# Patient Record
Sex: Female | Born: 1935 | Race: White | Hispanic: No | Smoking: Former smoker
Health system: Southern US, Community
[De-identification: ages and names within clinical notes are randomized; demographics above are authoritative.]

## PROBLEM LIST (undated history)

## (undated) DIAGNOSIS — F32A Depression, unspecified: Secondary | ICD-10-CM

## (undated) DIAGNOSIS — E039 Hypothyroidism, unspecified: Secondary | ICD-10-CM

## (undated) DIAGNOSIS — L4050 Arthropathic psoriasis, unspecified: Secondary | ICD-10-CM

## (undated) DIAGNOSIS — J449 Chronic obstructive pulmonary disease, unspecified: Secondary | ICD-10-CM

## (undated) DIAGNOSIS — F329 Major depressive disorder, single episode, unspecified: Secondary | ICD-10-CM

## (undated) DIAGNOSIS — I739 Peripheral vascular disease, unspecified: Secondary | ICD-10-CM

## (undated) DIAGNOSIS — R32 Unspecified urinary incontinence: Secondary | ICD-10-CM

## (undated) DIAGNOSIS — E785 Hyperlipidemia, unspecified: Secondary | ICD-10-CM

## (undated) DIAGNOSIS — I10 Essential (primary) hypertension: Secondary | ICD-10-CM

## (undated) DIAGNOSIS — G4730 Sleep apnea, unspecified: Secondary | ICD-10-CM

## (undated) DIAGNOSIS — R011 Cardiac murmur, unspecified: Secondary | ICD-10-CM

## (undated) DIAGNOSIS — B019 Varicella without complication: Secondary | ICD-10-CM

## (undated) DIAGNOSIS — Z9981 Dependence on supplemental oxygen: Secondary | ICD-10-CM

## (undated) DIAGNOSIS — K5792 Diverticulitis of intestine, part unspecified, without perforation or abscess without bleeding: Secondary | ICD-10-CM

## (undated) HISTORY — PX: CATARACT EXTRACTION: SUR2

## (undated) HISTORY — DX: Hypothyroidism, unspecified: E03.9

## (undated) HISTORY — PX: COLON RESECTION: SHX5231

## (undated) HISTORY — DX: Dependence on supplemental oxygen: Z99.81

## (undated) HISTORY — PX: VEIN LIGATION AND STRIPPING: SHX2653

## (undated) HISTORY — DX: Major depressive disorder, single episode, unspecified: F32.9

## (undated) HISTORY — DX: Peripheral vascular disease, unspecified: I73.9

## (undated) HISTORY — DX: Depression, unspecified: F32.A

## (undated) HISTORY — DX: Diverticulitis of intestine, part unspecified, without perforation or abscess without bleeding: K57.92

## (undated) HISTORY — PX: LUMBAR LAMINECTOMY: SHX95

## (undated) HISTORY — DX: Sleep apnea, unspecified: G47.30

## (undated) HISTORY — DX: Unspecified urinary incontinence: R32

## (undated) HISTORY — DX: Essential (primary) hypertension: I10

## (undated) HISTORY — DX: Hyperlipidemia, unspecified: E78.5

## (undated) HISTORY — DX: Varicella without complication: B01.9

## (undated) HISTORY — DX: Chronic obstructive pulmonary disease, unspecified: J44.9

## (undated) HISTORY — PX: OTHER SURGICAL HISTORY: SHX169

## (undated) HISTORY — DX: Arthropathic psoriasis, unspecified: L40.50

## (undated) HISTORY — DX: Cardiac murmur, unspecified: R01.1

## (undated) HISTORY — PX: INGUINAL HERNIA REPAIR: SUR1180

---

## 1975-10-12 HISTORY — PX: ABDOMINAL HYSTERECTOMY: SHX81

## 2016-10-06 ENCOUNTER — Ambulatory Visit: Payer: Self-pay | Admitting: Adult Health

## 2016-10-07 ENCOUNTER — Encounter: Payer: Self-pay | Admitting: Adult Health

## 2016-10-07 ENCOUNTER — Ambulatory Visit (INDEPENDENT_AMBULATORY_CARE_PROVIDER_SITE_OTHER): Payer: Medicare Other | Admitting: Adult Health

## 2016-10-07 VITALS — BP 128/80 | Temp 98.1°F | Wt 202.2 lb

## 2016-10-07 DIAGNOSIS — R109 Unspecified abdominal pain: Secondary | ICD-10-CM

## 2016-10-07 DIAGNOSIS — I872 Venous insufficiency (chronic) (peripheral): Secondary | ICD-10-CM

## 2016-10-07 DIAGNOSIS — J449 Chronic obstructive pulmonary disease, unspecified: Secondary | ICD-10-CM

## 2016-10-07 DIAGNOSIS — I10 Essential (primary) hypertension: Secondary | ICD-10-CM

## 2016-10-07 DIAGNOSIS — Z7689 Persons encountering health services in other specified circumstances: Secondary | ICD-10-CM

## 2016-10-07 MED ORDER — ATENOLOL 25 MG PO TABS: 25.0000 mg | ORAL_TABLET | Freq: Every day | ORAL | 3 refills | Status: AC

## 2016-10-07 NOTE — Patient Instructions (Signed)
It was great meeting you today!  Someone will call you to schedule your appointments with Vascular, GI, and Pulmonary.   I will get your paperwork from records from the previous family doctor. I will call you once I get everything and we can discuss follow up. Feel free to call me with any issues or concerns in the meantime.   I have added 25 mg of Atenolol to your night time regimen to help control your blood pressure through the night. Continue to monitor your blood pressure for me. Bring log to next appointment.

## 2016-10-07 NOTE — Progress Notes (Signed)
Patient presents to clinic today to establish care. She is a pleasant 82 year old female who  has a past medical history of Chicken pox; COPD (chronic obstructive pulmonary disease) (HCC); Depression; Diverticulitis; Essential hypertension; Heart murmur; Hyperlipidemia; Hypothyroidism; Psoriatic arthritis (HCC); and Urinary incontinence.   She is a poor historian   [place].   She believes her last physical was one year ago.   Acute Concerns: Establish Care   Chronic Issues: - Abdominal Cramping - happens after eating. She has been seen by GI in the past in [place]. She denies any constant diarrhea. Has a history of colon resection due to abscessed diverticula.   Essential Hypertension  - She takes Atenolol 50 mg and Chlorthalidone 25 mg. She reports that her blood pressure continues to be elevated. She monitors her blood pressure at home and reports that her blood pressure is elevated in the mornings. She has readings- per her log in the 180's when she wakes up.   Hypothyroidism - Takes Synthroid 150 mcg and feels as though she is well controlled on this medication  Venous Insufficiency - Unsure if she was seen by vascular in the past. She reports have discoloration with occasional episodes of numbness and tingling in her lower extremities. She does not wear compression socks on a regular basis.   Health Maintenance: Dental -- has dentures [facility] -- Does Routine Care. Immunizations -- Unknown UTD  Colonoscopy -- No longer needs Mammogram -- No longer needs PAP -- No longer needs Bone Density -- Unknown   Is followed by  1. Pulmonary - Was seeing Pulmonary in [place]. She will need to see someone here    Past Medical History:  Diagnosis Date  . Chicken pox   . COPD (chronic obstructive pulmonary disease) (HCC)   . Depression   . Diverticulitis   . Essential hypertension   . Heart murmur   . Hyperlipidemia   .  Hypothyroidism   . Psoriatic arthritis (HCC)   . Urinary incontinence     Past Surgical History:  Procedure Laterality Date  . ABDOMINAL HYSTERECTOMY  1977  . Appendectomy    . CATARACT EXTRACTION Bilateral   . COLON RESECTION    . INGUINAL HERNIA REPAIR    . Knee replacement     . LUMBAR LAMINECTOMY      No current outpatient prescriptions on file prior to visit.   No current facility-administered medications on file prior to visit.     Allergies not on file  Family History  Problem Relation Age of Onset  . Hyperlipidemia Father   . Hypertension Father   . Breast cancer Maternal Grandmother   . Heart attack Maternal Grandfather   . Heart disease Maternal Uncle     Social History   Social History  . Marital status: Legally Separated    Spouse name: N/A  . Number of children: N/A  . Years of education: N/A   Occupational History  . Not on file.   Social History Main Topics  . Smoking status: Former Games developermoker  . Smokeless tobacco: Never Used  . Alcohol use Yes     Comment:  socially  . Drug use: No  . Sexual activity: Not on file   Other Topics Concern  . Not on file   Social History Narrative   Sepeated    Worked as Radiology    Has a son and daughter. [place]  and works at [employer].     Review of Systems  Constitutional: Negative.   HENT: Negative.   Eyes: Negative.   Respiratory: Negative.   Cardiovascular: Negative.   Gastrointestinal: Positive for abdominal pain.  Genitourinary: Negative.        Incontinence   Musculoskeletal: Negative.   Skin: Negative.   Neurological: Negative.   Endo/Heme/Allergies: Negative.   Psychiatric/Behavioral: Negative.   All other systems reviewed and are negative.   BP 128/80   Temp 98.1 F (36.7 C) (Oral)   Wt 202 lb 4 oz (91.7 kg)   Physical Exam  Constitutional: She is oriented to person, place, and time and well-developed, well-nourished, and in no distress. No distress.  Eyes:  Conjunctivae and EOM are normal. Pupils are equal, round, and reactive to light. Right eye exhibits no discharge. Left eye exhibits no discharge. No scleral icterus.  Neck: Normal range of motion. Neck supple. No JVD present. No tracheal deviation present. No thyromegaly present.  Cardiovascular: Normal rate, regular rhythm, normal heart sounds and intact distal pulses.  Exam reveals no gallop and no friction rub.   No murmur heard. Pulmonary/Chest: Effort normal and breath sounds normal. No stridor. No respiratory distress. She has no wheezes. She has no rales. She exhibits no tenderness.  1.5 liters via [place]   Musculoskeletal:  Has steady gait with rolling walker  Lymphadenopathy:    She has no cervical adenopathy.  Neurological: She is alert and oriented to person, place, and time. She has normal reflexes. She displays normal reflexes. No cranial nerve deficit. She exhibits normal muscle tone. Gait normal. Coordination normal. GCS score is 15.  Skin: Skin is warm and dry. She is not diaphoretic.  Skin discoloration in bilateral lower extremities   Psychiatric: Mood, memory, affect and judgment normal.  Nursing note and vitals reviewed.  Assessment/Plan: 1. Encounter to establish care - Will request records from previous provider.  - Follow up for CPE or sooner with any acute issues.  - Exercise as tolerated.   2. Abdominal cramping  - Ambulatory referral to Gastroenterology  3. Venous insufficiency  - Ambulatory referral to Vascular Surgery  4. Chronic obstructive pulmonary disease, unspecified COPD type (HCC)  - Ambulatory referral to Pulmonology  5. Essential hypertension - I am going to have her take 25 mg of Atenolol in the evening. Continue to monitor BP and follow up with me in 1-2 weeks. Bring log to next appointment. Return precautions given    Shirline Freesory Bambie Pizzolato, NP

## 2016-10-07 NOTE — Progress Notes (Signed)
Pre visit review using our clinic review tool, if applicable. No additional management support is needed unless otherwise documented below in the visit note. 

## 2016-10-12 ENCOUNTER — Encounter: Payer: Self-pay | Admitting: Adult Health

## 2016-10-13 ENCOUNTER — Encounter: Payer: Self-pay | Admitting: Gastroenterology

## 2016-10-13 DIAGNOSIS — J449 Chronic obstructive pulmonary disease, unspecified: Secondary | ICD-10-CM | POA: Insufficient documentation

## 2016-10-15 ENCOUNTER — Other Ambulatory Visit: Payer: Self-pay | Admitting: *Deleted

## 2016-10-15 DIAGNOSIS — I872 Venous insufficiency (chronic) (peripheral): Secondary | ICD-10-CM

## 2016-10-21 ENCOUNTER — Inpatient Hospital Stay (HOSPITAL_COMMUNITY)
Admission: EM | Admit: 2016-10-21 | Discharge: 2016-10-29 | DRG: 190 | Disposition: A | Discharge status: Home Health | Payer: Medicare Other | Attending: Internal Medicine | Admitting: Internal Medicine

## 2016-10-21 ENCOUNTER — Encounter (HOSPITAL_COMMUNITY): Payer: Self-pay

## 2016-10-21 ENCOUNTER — Ambulatory Visit: Payer: Medicare Other | Admitting: Gastroenterology

## 2016-10-21 ENCOUNTER — Emergency Department (HOSPITAL_COMMUNITY): Payer: Medicare Other

## 2016-10-21 DIAGNOSIS — G2581 Restless legs syndrome: Secondary | ICD-10-CM | POA: Diagnosis not present

## 2016-10-21 DIAGNOSIS — L4050 Arthropathic psoriasis, unspecified: Secondary | ICD-10-CM | POA: Diagnosis present

## 2016-10-21 DIAGNOSIS — F419 Anxiety disorder, unspecified: Secondary | ICD-10-CM | POA: Diagnosis present

## 2016-10-21 DIAGNOSIS — Z8249 Family history of ischemic heart disease and other diseases of the circulatory system: Secondary | ICD-10-CM

## 2016-10-21 DIAGNOSIS — J441 Chronic obstructive pulmonary disease with (acute) exacerbation: Secondary | ICD-10-CM | POA: Diagnosis not present

## 2016-10-21 DIAGNOSIS — J9622 Acute and chronic respiratory failure with hypercapnia: Secondary | ICD-10-CM | POA: Diagnosis present

## 2016-10-21 DIAGNOSIS — Z515 Encounter for palliative care: Secondary | ICD-10-CM

## 2016-10-21 DIAGNOSIS — Z66 Do not resuscitate: Secondary | ICD-10-CM | POA: Diagnosis not present

## 2016-10-21 DIAGNOSIS — Z7189 Other specified counseling: Secondary | ICD-10-CM

## 2016-10-21 DIAGNOSIS — E785 Hyperlipidemia, unspecified: Secondary | ICD-10-CM | POA: Diagnosis present

## 2016-10-21 DIAGNOSIS — R52 Pain, unspecified: Secondary | ICD-10-CM

## 2016-10-21 DIAGNOSIS — Z79899 Other long term (current) drug therapy: Secondary | ICD-10-CM

## 2016-10-21 DIAGNOSIS — E222 Syndrome of inappropriate secretion of antidiuretic hormone: Secondary | ICD-10-CM | POA: Diagnosis present

## 2016-10-21 DIAGNOSIS — Z888 Allergy status to other drugs, medicaments and biological substances status: Secondary | ICD-10-CM

## 2016-10-21 DIAGNOSIS — F32A Depression, unspecified: Secondary | ICD-10-CM

## 2016-10-21 DIAGNOSIS — J9621 Acute and chronic respiratory failure with hypoxia: Secondary | ICD-10-CM | POA: Diagnosis present

## 2016-10-21 DIAGNOSIS — R0789 Other chest pain: Secondary | ICD-10-CM | POA: Diagnosis not present

## 2016-10-21 DIAGNOSIS — Z885 Allergy status to narcotic agent status: Secondary | ICD-10-CM

## 2016-10-21 DIAGNOSIS — E039 Hypothyroidism, unspecified: Secondary | ICD-10-CM

## 2016-10-21 DIAGNOSIS — Z87891 Personal history of nicotine dependence: Secondary | ICD-10-CM

## 2016-10-21 DIAGNOSIS — I10 Essential (primary) hypertension: Secondary | ICD-10-CM | POA: Diagnosis not present

## 2016-10-21 DIAGNOSIS — J9611 Chronic respiratory failure with hypoxia: Secondary | ICD-10-CM

## 2016-10-21 DIAGNOSIS — Z7952 Long term (current) use of systemic steroids: Secondary | ICD-10-CM

## 2016-10-21 DIAGNOSIS — Z9981 Dependence on supplemental oxygen: Secondary | ICD-10-CM

## 2016-10-21 DIAGNOSIS — F329 Major depressive disorder, single episode, unspecified: Secondary | ICD-10-CM

## 2016-10-21 DIAGNOSIS — I739 Peripheral vascular disease, unspecified: Secondary | ICD-10-CM | POA: Diagnosis present

## 2016-10-21 DIAGNOSIS — Z96652 Presence of left artificial knee joint: Secondary | ICD-10-CM | POA: Diagnosis present

## 2016-10-21 DIAGNOSIS — B974 Respiratory syncytial virus as the cause of diseases classified elsewhere: Secondary | ICD-10-CM | POA: Diagnosis present

## 2016-10-21 DIAGNOSIS — F411 Generalized anxiety disorder: Secondary | ICD-10-CM

## 2016-10-21 DIAGNOSIS — G4733 Obstructive sleep apnea (adult) (pediatric): Secondary | ICD-10-CM | POA: Diagnosis present

## 2016-10-21 LAB — RESPIRATORY PANEL BY PCR
ADENOVIRUS-RVPPCR: NOT DETECTED
BORDETELLA PERTUSSIS-RVPCR: NOT DETECTED
CHLAMYDOPHILA PNEUMONIAE-RVPPCR: NOT DETECTED
CORONAVIRUS 229E-RVPPCR: NOT DETECTED
CORONAVIRUS HKU1-RVPPCR: NOT DETECTED
CORONAVIRUS NL63-RVPPCR: NOT DETECTED
Coronavirus OC43: NOT DETECTED
Influenza A: NOT DETECTED
Influenza B: NOT DETECTED
Metapneumovirus: NOT DETECTED
Mycoplasma pneumoniae: NOT DETECTED
PARAINFLUENZA VIRUS 2-RVPPCR: NOT DETECTED
Parainfluenza Virus 1: NOT DETECTED
Parainfluenza Virus 3: NOT DETECTED
Parainfluenza Virus 4: NOT DETECTED
Respiratory Syncytial Virus: DETECTED — AB
Rhinovirus / Enterovirus: NOT DETECTED

## 2016-10-21 LAB — BASIC METABOLIC PANEL
Anion gap: 10 (ref 5–15)
BUN: 15 mg/dL (ref 6–20)
CO2: 35 mmol/L — ABNORMAL HIGH (ref 22–32)
CREATININE: 0.65 mg/dL (ref 0.44–1.00)
Calcium: 9.2 mg/dL (ref 8.9–10.3)
Chloride: 84 mmol/L — ABNORMAL LOW (ref 101–111)
GFR calc Af Amer: >60 mL/min (ref >60)
GLUCOSE: 93 mg/dL (ref 65–99)
POTASSIUM: 3.7 mmol/L (ref 3.5–5.1)
SODIUM: 129 mmol/L — AB (ref 135–145)

## 2016-10-21 LAB — I-STAT ARTERIAL BLOOD GAS, ED
Acid-Base Excess: 9 mmol/L — ABNORMAL HIGH (ref 0.0–2.0)
BICARBONATE: 36.3 mmol/L — AB (ref 20.0–28.0)
O2 Saturation: 92 %
PO2 ART: 66 mmHg — AB (ref 83.0–108.0)
TCO2: 38 mmol/L (ref 0–100)
pCO2 arterial: 60.5 mmHg — ABNORMAL HIGH (ref 32.0–48.0)
pH, Arterial: 7.386 (ref 7.350–7.450)

## 2016-10-21 LAB — BRAIN NATRIURETIC PEPTIDE: B Natriuretic Peptide: 82.4 pg/mL (ref 0.0–100.0)

## 2016-10-21 LAB — CBC
HCT: 38.4 % (ref 36.0–46.0)
Hemoglobin: 13 g/dL (ref 12.0–15.0)
MCH: 30.6 pg (ref 26.0–34.0)
MCHC: 33.9 g/dL (ref 30.0–36.0)
MCV: 90.4 fL (ref 78.0–100.0)
PLATELETS: 273 10*3/uL (ref 150–400)
RBC: 4.25 MIL/uL (ref 3.87–5.11)
RDW: 12.6 % (ref 11.5–15.5)
WBC: 5.5 10*3/uL (ref 4.0–10.5)

## 2016-10-21 LAB — TROPONIN I: Troponin I: <0.03 ng/mL (ref <0.03)

## 2016-10-21 LAB — PROCALCITONIN

## 2016-10-21 MED ORDER — GI COCKTAIL ~~LOC~~: 30.0000 mL | Freq: Four times a day (QID) | ORAL | Status: DC | PRN

## 2016-10-21 MED ORDER — IPRATROPIUM BROMIDE 0.02 % IN SOLN
0.5000 mg | Freq: Once | RESPIRATORY_TRACT | Status: AC
  Administered 2016-10-21: 0.5 mg via RESPIRATORY_TRACT
  Filled 2016-10-21: qty 2.5

## 2016-10-21 MED ORDER — ONDANSETRON HCL 4 MG/2ML IJ SOLN
4.0000 mg | Freq: Four times a day (QID) | INTRAMUSCULAR | Status: DC | PRN
  Administered 2016-10-21 – 2016-10-22 (×3): 4 mg via INTRAVENOUS
  Filled 2016-10-21 (×3): qty 2

## 2016-10-21 MED ORDER — CHLORTHALIDONE 25 MG PO TABS
25.0000 mg | ORAL_TABLET | Freq: Every day | ORAL | Status: DC
  Administered 2016-10-21: 25 mg via ORAL
  Filled 2016-10-21 (×2): qty 1

## 2016-10-21 MED ORDER — GUAIFENESIN ER 600 MG PO TB12
1200.0000 mg | ORAL_TABLET | Freq: Two times a day (BID) | ORAL | Status: DC
  Administered 2016-10-21 – 2016-10-29 (×16): 1200 mg via ORAL
  Filled 2016-10-21 (×17): qty 2

## 2016-10-21 MED ORDER — ALBUTEROL SULFATE (2.5 MG/3ML) 0.083% IN NEBU
INHALATION_SOLUTION | RESPIRATORY_TRACT | Status: AC
  Filled 2016-10-21: qty 3

## 2016-10-21 MED ORDER — METHYLPREDNISOLONE SODIUM SUCC 125 MG IJ SOLR
60.0000 mg | Freq: Three times a day (TID) | INTRAMUSCULAR | Status: DC
  Administered 2016-10-21 – 2016-10-25 (×11): 60 mg via INTRAVENOUS
  Filled 2016-10-21 (×11): qty 2

## 2016-10-21 MED ORDER — CITALOPRAM HYDROBROMIDE 20 MG PO TABS
20.0000 mg | ORAL_TABLET | Freq: Every day | ORAL | Status: DC
  Administered 2016-10-21: 20 mg via ORAL
  Filled 2016-10-21: qty 1
  Filled 2016-10-21: qty 2

## 2016-10-21 MED ORDER — PRAMIPEXOLE DIHYDROCHLORIDE 1.5 MG PO TABS: 1.5000 mg | ORAL_TABLET | Freq: Every day | ORAL | Status: DC

## 2016-10-21 MED ORDER — LEVOTHYROXINE SODIUM 75 MCG PO TABS
150.0000 ug | ORAL_TABLET | Freq: Every day | ORAL | Status: DC
  Administered 2016-10-21 – 2016-10-25 (×4): 150 ug via ORAL
  Filled 2016-10-21 (×4): qty 2
  Filled 2016-10-21: qty 1

## 2016-10-21 MED ORDER — ENOXAPARIN SODIUM 40 MG/0.4ML ~~LOC~~ SOLN: 40.0000 mg | SUBCUTANEOUS | Status: DC

## 2016-10-21 MED ORDER — METHYLPREDNISOLONE SODIUM SUCC 125 MG IJ SOLR
80.0000 mg | Freq: Once | INTRAMUSCULAR | Status: AC
  Administered 2016-10-21: 80 mg via INTRAVENOUS
  Filled 2016-10-21: qty 2

## 2016-10-21 MED ORDER — LORAZEPAM 2 MG/ML IJ SOLN
0.5000 mg | INTRAMUSCULAR | Status: DC | PRN
  Administered 2016-10-21 – 2016-10-23 (×2): 0.5 mg via INTRAVENOUS
  Filled 2016-10-21 (×2): qty 1

## 2016-10-21 MED ORDER — SODIUM CHLORIDE 0.9% FLUSH
3.0000 mL | Freq: Two times a day (BID) | INTRAVENOUS | Status: DC
  Administered 2016-10-21 – 2016-10-29 (×12): 3 mL via INTRAVENOUS

## 2016-10-21 MED ORDER — ACETAMINOPHEN 325 MG PO TABS
650.0000 mg | ORAL_TABLET | Freq: Four times a day (QID) | ORAL | Status: DC | PRN
  Administered 2016-10-23: 650 mg via ORAL
  Filled 2016-10-21: qty 2

## 2016-10-21 MED ORDER — METHYLPREDNISOLONE SODIUM SUCC 125 MG IJ SOLR: 125.0000 mg | Freq: Once | INTRAMUSCULAR | Status: DC

## 2016-10-21 MED ORDER — SODIUM CHLORIDE 0.9% FLUSH
3.0000 mL | Freq: Two times a day (BID) | INTRAVENOUS | Status: DC
  Administered 2016-10-21 – 2016-10-23 (×3): 3 mL via INTRAVENOUS

## 2016-10-21 MED ORDER — LEVOFLOXACIN IN D5W 750 MG/150ML IV SOLN
750.0000 mg | INTRAVENOUS | Status: DC
  Administered 2016-10-21 – 2016-10-22 (×2): 750 mg via INTRAVENOUS
  Filled 2016-10-21 (×3): qty 150

## 2016-10-21 MED ORDER — PREDNISONE 20 MG PO TABS
60.0000 mg | ORAL_TABLET | Freq: Once | ORAL | Status: AC
  Administered 2016-10-21: 60 mg via ORAL
  Filled 2016-10-21: qty 3

## 2016-10-21 MED ORDER — MAGNESIUM SULFATE 2 GM/50ML IV SOLN
2.0000 g | Freq: Once | INTRAVENOUS | Status: AC
  Administered 2016-10-21: 2 g via INTRAVENOUS
  Filled 2016-10-21: qty 50

## 2016-10-21 MED ORDER — MOMETASONE FURO-FORMOTEROL FUM 200-5 MCG/ACT IN AERO
2.0000 | INHALATION_SPRAY | Freq: Two times a day (BID) | RESPIRATORY_TRACT | Status: DC
  Administered 2016-10-21 – 2016-10-29 (×15): 2 via RESPIRATORY_TRACT
  Filled 2016-10-21: qty 8.8

## 2016-10-21 MED ORDER — ACETAMINOPHEN 650 MG RE SUPP: 650.0000 mg | Freq: Four times a day (QID) | RECTAL | Status: DC | PRN

## 2016-10-21 MED ORDER — ALBUTEROL (5 MG/ML) CONTINUOUS INHALATION SOLN
10.0000 mg/h | INHALATION_SOLUTION | RESPIRATORY_TRACT | Status: DC
  Administered 2016-10-21: 10 mg/h via RESPIRATORY_TRACT
  Filled 2016-10-21: qty 20

## 2016-10-21 MED ORDER — SODIUM CHLORIDE 0.9 % IV SOLN: 250.0000 mL | INTRAVENOUS | Status: DC | PRN

## 2016-10-21 MED ORDER — MORPHINE SULFATE (PF) 2 MG/ML IV SOLN
2.0000 mg | INTRAVENOUS | Status: DC | PRN
  Administered 2016-10-21 – 2016-10-24 (×9): 2 mg via INTRAVENOUS
  Filled 2016-10-21 (×9): qty 1

## 2016-10-21 MED ORDER — ONDANSETRON HCL 4 MG PO TABS: 4.0000 mg | ORAL_TABLET | Freq: Four times a day (QID) | ORAL | Status: DC | PRN

## 2016-10-21 MED ORDER — ENOXAPARIN SODIUM 40 MG/0.4ML ~~LOC~~ SOLN
40.0000 mg | SUBCUTANEOUS | Status: DC
  Administered 2016-10-21 – 2016-10-28 (×8): 40 mg via SUBCUTANEOUS
  Filled 2016-10-21 (×9): qty 0.4

## 2016-10-21 MED ORDER — ACETAMINOPHEN 500 MG PO TABS
1000.0000 mg | ORAL_TABLET | Freq: Once | ORAL | Status: AC
  Administered 2016-10-21: 1000 mg via ORAL
  Filled 2016-10-21: qty 2

## 2016-10-21 MED ORDER — TIOTROPIUM BROMIDE MONOHYDRATE 18 MCG IN CAPS
18.0000 ug | ORAL_CAPSULE | Freq: Every day | RESPIRATORY_TRACT | Status: DC
  Administered 2016-10-21 – 2016-10-25 (×4): 18 ug via RESPIRATORY_TRACT
  Filled 2016-10-21: qty 5

## 2016-10-21 MED ORDER — SODIUM CHLORIDE 0.9 % IV SOLN
INTRAVENOUS | Status: DC
  Administered 2016-10-21: 12:00:00 via INTRAVENOUS

## 2016-10-21 MED ORDER — PRAMIPEXOLE DIHYDROCHLORIDE 1 MG PO TABS
1.5000 mg | ORAL_TABLET | Freq: Every day | ORAL | Status: DC
  Administered 2016-10-21 – 2016-10-22 (×2): 1.5 mg via ORAL
  Filled 2016-10-21 (×2): qty 2

## 2016-10-21 MED ORDER — SODIUM CHLORIDE 0.9% FLUSH: 3.0000 mL | INTRAVENOUS | Status: DC | PRN

## 2016-10-21 MED ORDER — ALBUTEROL SULFATE (2.5 MG/3ML) 0.083% IN NEBU
5.0000 mg | INHALATION_SOLUTION | Freq: Once | RESPIRATORY_TRACT | Status: AC
  Administered 2016-10-21: 5 mg via RESPIRATORY_TRACT

## 2016-10-21 MED ORDER — ATENOLOL 25 MG PO TABS
25.0000 mg | ORAL_TABLET | Freq: Every day | ORAL | Status: DC
  Administered 2016-10-21 – 2016-10-24 (×4): 25 mg via ORAL
  Filled 2016-10-21 (×4): qty 1

## 2016-10-21 NOTE — ED Notes (Signed)
Pt's son has returned to bedside and given update.

## 2016-10-21 NOTE — ED Provider Notes (Signed)
[facility]-EMERGENCY DEPT Provider Note   CSN: [id number] Arrival date & time: 10/21/16  0437     History   Chief Complaint Chief Complaint  Patient presents with  . Shortness of Breath    HPI Teresa Weiss Teresa Weiss is a 83 y.o. female.  HPI   Patient is a 83 year old female with history of COPD, hypertension and hyperlipidemia who presents the ED with complaint of worsening shortness of breath, onset 3-4 days. Patient reports she has had worsening shortness of breath with associated dry cough, chest tightness, wheezing. Patient states she has been taking her home COPD meds as prescribed without relief. She notes she was called in a prescription by her pulmonologist and [facility] yesterday for doxycycline and Solu-Medrol which she took 1 dose of yesterday. Denies fever, chills, hemoptysis, CP, palpitations, N/V/D, urinary sxs. Pt reports she has continued using her home oxygen, 4L, at all times and denies any recent need to increase. Denies smoking. Denies any known sick contacts. Denies any recent hospitalizations.  Past Medical History:  Diagnosis Date  . Chicken pox   . COPD (chronic obstructive pulmonary disease) (HCC)   . Depression   . Diverticulitis   . Essential hypertension   . Heart murmur   . Hyperlipidemia   . Hypothyroidism   . Psoriatic arthritis (HCC)   . Urinary incontinence     Patient Active Problem List   Diagnosis Date Noted  . COPD exacerbation (HCC) 10/21/2016  . COPD (chronic obstructive pulmonary disease) (HCC) 10/13/2016    Past Surgical History:  Procedure Laterality Date  . ABDOMINAL HYSTERECTOMY  1977  . Appendectomy    . CATARACT EXTRACTION Bilateral   . COLON RESECTION    . INGUINAL HERNIA REPAIR    . Knee replacement     . LUMBAR LAMINECTOMY      OB History    No data available       Home Medications    Prior to Admission medications   Medication Sig Start Date End Date Taking? Authorizing Provider  albuterol  (PROVENTIL HFA;VENTOLIN HFA) 108 (90 Base) MCG/ACT inhaler Inhale 1-2 puffs into the lungs every 6 (six) hours as needed for wheezing or shortness of breath.   Yes Historical Provider, MD  atenolol (TENORMIN) 25 MG tablet Take 1 tablet (25 mg total) by mouth at bedtime. Patient taking differently: Take 12.5 mg by mouth 2 (two) times daily.  10/07/16  Yes Shirline Frees, NP  budesonide-formoterol (SYMBICORT) 160-4.5 MCG/ACT inhaler Inhale 2 puffs into the lungs 3 (three) times daily.   Yes Historical Provider, MD  chlorthalidone (HYGROTON) 25 MG tablet Take 25 mg by mouth daily.   Yes Historical Provider, MD  cholecalciferol (VITAMIN D) 1000 units tablet Take 1,000 Units by mouth daily.   Yes Historical Provider, MD  citalopram (CELEXA) 20 MG tablet Take 1 tablet by mouth daily. 05/19/15  Yes Historical Provider, MD  doxycycline (VIBRA-TABS) 100 MG tablet Take 100 mg by mouth 2 (two) times daily. 10/19/16  Yes Historical Provider, MD  guaiFENesin (MUCINEX) 600 MG 12 hr tablet Take 600 mg by mouth 2 (two) times daily as needed for cough.   Yes Historical Provider, MD  ibuprofen (ADVIL,MOTRIN) 200 MG tablet Take 200 mg by mouth every 6 (six) hours as needed for moderate pain.   Yes Historical Provider, MD  levothyroxine (SYNTHROID, LEVOTHROID) 150 MCG tablet Take 1 tablet by mouth daily.   Yes Historical Provider, MD  Multiple Vitamin (MULTIVITAMIN) tablet Take 1 tablet by mouth daily.  Yes Historical Provider, MD  pramipexole (MIRAPEX) 1.5 MG tablet Take 1.5 mg by mouth at bedtime. Take 1/2 tablet at noon and 1 tablet at bedtime   Yes Historical Provider, MD  predniSONE (DELTASONE) 10 MG tablet Take 10-40 mg by mouth See admin instructions. 40mg  for 3 days, 30mg  for 3 days, 20mg  for 3 days, 10mg  for 3 days 10/19/16  Yes Historical Provider, MD  tiotropium (SPIRIVA) 18 MCG inhalation capsule Place 18 mcg into inhaler and inhale daily.    Yes Historical Provider, MD    Family History Family History  Problem  Relation Age of Onset  . Hyperlipidemia Father   . Hypertension Father   . Breast cancer Maternal Grandmother   . Heart attack Maternal Grandfather   . Heart disease Maternal Uncle     Social History Social History  Substance Use Topics  . Smoking status: Former Games developer  . Smokeless tobacco: Never Used  . Alcohol use Yes     Comment:  socially     Allergies   Infliximab and Vicodin [hydrocodone-acetaminophen]   Review of Systems Review of Systems  Respiratory: Positive for cough, chest tightness, shortness of breath and wheezing.   All other systems reviewed and are negative.    Physical Exam Updated Vital Signs BP 142/82   Pulse 90   Temp 98 F (36.7 C)   Resp 24   Ht 5\' 5"  (1.651 m)   Wt 91.6 kg   SpO2 94%   BMI 33.61 kg/m   Physical Exam  Constitutional: She is oriented to person, place, and time. She appears well-developed and well-nourished. No distress.  Morbidly obese female  HENT:  Head: Normocephalic and atraumatic.  Mouth/Throat: Uvula is midline, oropharynx is clear and moist and mucous membranes are normal. No oropharyngeal exudate, posterior oropharyngeal edema, posterior oropharyngeal erythema or tonsillar abscesses. No tonsillar exudate.  Eyes: Conjunctivae and EOM are normal. Right eye exhibits no discharge. Left eye exhibits no discharge. No scleral icterus.  Neck: Normal range of motion. Neck supple.  Cardiovascular: Normal rate, regular rhythm, normal heart sounds and intact distal pulses.   Pulmonary/Chest: Effort normal. No respiratory distress. She has wheezes. She has rales. She exhibits no tenderness.  Expiratory wheezing bilaterally and rhonchi present in bilateral bases. Pt without signs of respiratory distress or increased work of breathing.   Abdominal: Soft. Bowel sounds are normal. She exhibits no distension and no mass. There is no tenderness. There is no rebound and no guarding. No hernia.  Musculoskeletal: Normal range of motion.  She exhibits edema.  2+ pitting edema noted to BLE which pt reports is chronic and unchanged.  Neurological: She is alert and oriented to person, place, and time.  Skin: Skin is warm and dry. She is not diaphoretic.  Nursing note and vitals reviewed.    ED Treatments / Results  Labs (all labs ordered are listed, but only abnormal results are displayed) Labs Reviewed  BASIC METABOLIC PANEL - Abnormal; Notable for the following:       Result Value   Sodium 129 (*)    Chloride 84 (*)    CO2 35 (*)    All other components within normal limits  I-STAT ARTERIAL BLOOD GAS, ED - Abnormal; Notable for the following:    pCO2 arterial 60.5 (*)    pO2, Arterial 66.0 (*)    Bicarbonate 36.3 (*)    Acid-Base Excess 9.0 (*)    All other components within normal limits  CBC  TROPONIN I  BRAIN NATRIURETIC PEPTIDE  BLOOD GAS, ARTERIAL    EKG  EKG Interpretation None       Radiology Dg Chest 2 View  Result Date: 10/21/2016 CLINICAL DATA:  Increasing shortness of breath for 3 days. History of COPD. EXAM: CHEST  2 VIEW COMPARISON:  None. FINDINGS: Mild cardiac enlargement with normal pulmonary vascularity. Emphysematous changes and scattered fibrosis in the lungs. No focal airspace disease or consolidation. No blunting of costophrenic angles. No pneumothorax. Calcified and tortuous aorta. Degenerative changes in the spine. IMPRESSION: Cardiac enlargement. Emphysematous changes in the lungs. No evidence of active pulmonary disease. Electronically Signed   By: Burman Nieves M.D.   On: 10/21/2016 05:24    Procedures .Critical Care Performed by: Barrett Henle Authorized by: Barrett Henle   Critical care provider statement:    Critical care time (minutes):  40   Critical care was necessary to treat or prevent imminent or life-threatening deterioration of the following conditions:  Respiratory failure (COPD exacerbation)   Critical care was time spent personally by me  on the following activities:  Blood draw for specimens, development of treatment plan with patient or surrogate, discussions with consultants, evaluation of patient's response to treatment, examination of patient, interpretation of cardiac output measurements, obtaining history from patient or surrogate, ventilator management, review of old charts, re-evaluation of patient's condition, pulse oximetry, ordering and review of radiographic studies, ordering and review of laboratory studies and ordering and performing treatments and interventions   (including critical care time)  Medications Ordered in ED Medications  albuterol (PROVENTIL,VENTOLIN) solution continuous neb (10 mg/hr Nebulization New Bag/Given 10/21/16 0715)  albuterol (PROVENTIL) (2.5 MG/3ML) 0.083% nebulizer solution 5 mg (5 mg Nebulization Given 10/21/16 0458)  acetaminophen (TYLENOL) tablet 1,000 mg (1,000 mg Oral Given 10/21/16 0702)  ipratropium (ATROVENT) nebulizer solution 0.5 mg (0.5 mg Nebulization Given 10/21/16 0716)  predniSONE (DELTASONE) tablet 60 mg (60 mg Oral Given 10/21/16 0701)  magnesium sulfate IVPB 2 g 50 mL (2 g Intravenous New Bag/Given 10/21/16 0847)  methylPREDNISolone sodium succinate (SOLU-MEDROL) 125 mg/2 mL injection 80 mg (80 mg Intravenous Given 10/21/16 0902)     Initial Impression / Assessment and Plan / ED Course  I have reviewed the triage vital signs and the nursing notes.  Pertinent labs & imaging results that were available during my care of the patient were reviewed by me and considered in my medical decision making (see chart for details).  Clinical Course    Pt presents with worsening SOB over the past 3-4 days. Endorses associated dry cough, chest tightness and wheezing. Pt reports wearing 4L oxygen at home at baseline. Reports starting rx of doxycycline and solumedrol yesterday that was called in by her pulmonologist in [facility]. Denies fever or CP. VSS. O2 sat 98% on 4L [place]. Pt given  albuterol neb in triage. Exam revealed expiratory wheezing bilaterally and rhonchi present in bilateral bases. Pt without signs of respiratory distress or increased work of breathing. 2+ pitting edema noted to BLE which pt reports is chronic and unchanged. Pt given 0.5mg  ipatroprium with continuous albuterol neb and 60mg  prednisone PO.   CXR showed cardiac enlargement with emphysematous changes in lungs, no active pulmonary disease noted. I was notified by nurse that pt's breathing worsened after completing continuous neb. On reevaluation, pt with increased work of breathing, O2 sat 90-94% on 4L [place]. Discussed pt with Dr. Donnald Garre who evaluated pt in the ED. Plan to give pt 80mg  solumedrol and place pt on BiPAP. Pt's  breathing has improved since starting BiPAP. Consulted hospitalist, Dr. Konrad DoloresMerrell agrees to admission. Orders placed for obs telemetry bed. Discussed results and plan for admission for COPD exacerbation with pt.   Final Clinical Impressions(s) / ED Diagnoses   Final diagnoses:  COPD exacerbation [facility](HCC)    New Prescriptions New Prescriptions   No medications on file        Barrett Henleicole Elizabeth Quisha Mabie, [place]-C 10/21/16 [id number]    Arby BarretteMarcy Pfeiffer, MD 10/26/16 [phone]

## 2016-10-21 NOTE — ED Notes (Signed)
pts shoes, blouse and pocketbook sent upstairs with patient.

## 2016-10-21 NOTE — ED Notes (Signed)
Bipap has been removed by respiratory therapist. Pt in NAD.

## 2016-10-21 NOTE — ED Notes (Signed)
Dr. Konrad DoloresMerrell gave order to place pt back on bipap, per pt request.

## 2016-10-21 NOTE — ED Notes (Signed)
Call place to RT for hour neb treatment

## 2016-10-21 NOTE — ED Notes (Signed)
Report attempted 

## 2016-10-21 NOTE — ED Notes (Signed)
Pt called out stating she could not tolerate any more of the continuous neb treatment. Neb taken off and pt placed back on 4L [place]. Pt appeared to be working more to breathe but did not appear to be in any distress but MarionNicole, [place] and respiratory therapist informed and called to bedside for assessment. Respiratory stated pt's nebulizer treatment had finished and she tolerated it for a little over an hour before it was taken off.

## 2016-10-21 NOTE — ED Notes (Signed)
Dr. Donnald GarrePfeiffer at bedside to provide update to pts son.

## 2016-10-21 NOTE — ED Notes (Signed)
Pt able to rest; tolerating BIPAP well at this time. Pt appears to be in no acute distress.

## 2016-10-21 NOTE — Progress Notes (Signed)
Pharmacy Antibiotic Note  Teresa AdieMarlene Evonne Weiss Teresa Weiss is a 83 y.o. female admitted on 10/21/2016 with COPD exacerbation.  Pharmacy has been consulted for levaquin dosing.  Plan: Levaquin 750mg  IV q48h Monitor culture data, renal function and clinical course  Height: 5\' 5"  (165.1 cm) Weight: 202 lb (91.6 kg) IBW/kg (Calculated) : 57  Temp (24hrs), Avg:98.1 F (36.7 C), Min:98 F (36.7 C), Max:98.1 F (36.7 C)   Recent Labs Lab 10/21/16 0455  WBC 5.5  CREATININE 0.65    Estimated Creatinine Clearance: 61.6 mL/min (by C-G formula based on SCr of 0.65 mg/dL).    Allergies  Allergen Reactions  . Infliximab Shortness Of Breath    Respiratory complications.  . Vicodin [Hydrocodone-Acetaminophen] Nausea And Vomiting     Arlean Hoppingorey M. Newman PiesBall, PharmD, BCPS Clinical Pharmacist Pager [phone] 10/21/2016 10:36 AM

## 2016-10-21 NOTE — ED Triage Notes (Signed)
Pt states that for the past 3-4 days she has been having increased SOB with chest tightness, fevers yesterday. Nonproductive cough, head and chest congestion. Hx of COPD.

## 2016-10-21 NOTE — Progress Notes (Signed)
Pt looks much better. The Hospitialist said to try her off of BIPAP. BIPAP was taken off at 1045 and placed on 4L [place]. Pt is very concerned about being able to get her symbicort. RT will get in touch with the doctor. RT will continue to monitor.

## 2016-10-21 NOTE — ED Notes (Signed)
Pt's son, Vergie LivingRich Lundy, would like to be called with any updates. Phone number: [phone]

## 2016-10-21 NOTE — H&P (Addendum)
History and Physical    Teresa Weiss RUE:[id number] DOB: 09/12/1933 DOA: 10/21/2016  PCP: Shirline Frees, NP Patient coming from: [facility].   Chief Complaint: SOB and wheezing  HPI: Teresa Weiss is a 83 y.o. female with medical history significant of oxygen dependent COPD, depression, diverticulosis, hypertension, hyperlipidemia, psoriasis, urinary incontinence, presenting with 3-4 day history of worsening shortness of breath. Associated with cough and wheezing. States that she feels there is phlegm in her chest but cannot cough it up. Inhalers with minimal relief at home. Doesn't endorse some chest tightness but denies actual chest pain or palpitations, diaphoresis, nausea, LOC. Patient was started on doxycycline 2 days ago as well as prednisone 40 mg. At baseline patient is on 4 L and sometimes increases her oxygen to 6 L when exerting herself. Denies fevers, abdominal pain, dysuria, frequency, back pain, rash, neck stiffness, headache. [facility] is the process [facility].   ED Course: One hour CAT, Solu-Medrol 125 mg, magnesium, BiPAP with improvement in condition. Objective findings outlined below.  Review of Systems: As per HPI otherwise 10 point review of systems negative.   Ambulatory Status: Limited by oxygen dependency.  Past Medical History:  Diagnosis Date  . Chicken pox   . COPD (chronic obstructive pulmonary disease) (HCC)   . Depression   . Diverticulitis   . Essential hypertension   . Heart murmur   . Hyperlipidemia   . Hypothyroidism   . Psoriatic arthritis (HCC)   . Urinary incontinence     Past Surgical History:  Procedure Laterality Date  . ABDOMINAL HYSTERECTOMY  1977  . Appendectomy    . CATARACT EXTRACTION Bilateral   . COLON RESECTION    . INGUINAL HERNIA REPAIR    . Knee replacement     . LUMBAR LAMINECTOMY      Social History   Social History  . Marital status:  Legally Separated    Spouse name: N/A  . Number of children: N/A  . Years of education: N/A   Occupational History  . Not on file.   Social History Main Topics  . Smoking status: Former Games developer  . Smokeless tobacco: Never Used  . Alcohol use Yes     Comment:  socially  . Drug use: No  . Sexual activity: Not on file   Other Topics Concern  . 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 Son lives in [place] and works at [employer].     Allergies  Allergen Reactions  . Infliximab Shortness Of Breath    Respiratory complications.  . Vicodin [Hydrocodone-Acetaminophen] Nausea And Vomiting    Family History  Problem Relation Age of Onset  . Hyperlipidemia Father   . Hypertension Father   . Breast cancer Maternal Grandmother   . Heart attack Maternal Grandfather   . Heart disease Maternal Uncle     Prior to Admission medications   Medication Sig Start Date End Date Taking? Authorizing Provider  albuterol (PROVENTIL HFA;VENTOLIN HFA) 108 (90 Base) MCG/ACT inhaler Inhale 1-2 puffs into the lungs every 6 (six) hours as needed for wheezing or shortness of breath.   Yes Historical Provider, MD  atenolol (TENORMIN) 25 MG tablet Take 1 tablet (25 mg total) by mouth at bedtime. Patient taking differently: Take 12.5 mg by mouth 2 (two) times daily.  10/07/16  Yes Shirline Frees,  NP  budesonide-formoterol (SYMBICORT) 160-4.5 MCG/ACT inhaler Inhale 2 puffs into the lungs 3 (three) times daily.   Yes Historical Provider, MD  chlorthalidone (HYGROTON) 25 MG tablet Take 25 mg by mouth daily.   Yes Historical Provider, MD  cholecalciferol (VITAMIN D) 1000 units tablet Take 1,000 Units by mouth daily.   Yes Historical Provider, MD  citalopram (CELEXA) 20 MG tablet Take 1 tablet by mouth daily. 05/19/15  Yes Historical Provider, MD  doxycycline (VIBRA-TABS) 100 MG tablet Take 100 mg by mouth 2 (two) times daily. 10/19/16  Yes Historical  Provider, MD  guaiFENesin (MUCINEX) 600 MG 12 hr tablet Take 600 mg by mouth 2 (two) times daily as needed for cough.   Yes Historical Provider, MD  ibuprofen (ADVIL,MOTRIN) 200 MG tablet Take 200 mg by mouth every 6 (six) hours as needed for moderate pain.   Yes Historical Provider, MD  levothyroxine (SYNTHROID, LEVOTHROID) 150 MCG tablet Take 1 tablet by mouth daily.   Yes Historical Provider, MD  Multiple Vitamin (MULTIVITAMIN) tablet Take 1 tablet by mouth daily.   Yes Historical Provider, MD  pramipexole (MIRAPEX) 1.5 MG tablet Take 1.5 mg by mouth at bedtime. Take 1/2 tablet at noon and 1 tablet at bedtime   Yes Historical Provider, MD  predniSONE (DELTASONE) 10 MG tablet Take 10-40 mg by mouth See admin instructions. 40mg  for 3 days, 30mg  for 3 days, 20mg  for 3 days, 10mg  for 3 days 10/19/16  Yes Historical Provider, MD  tiotropium (SPIRIVA) 18 MCG inhalation capsule Place 18 mcg into inhaler and inhale daily.    Yes Historical Provider, MD    Physical Exam: Vitals:   10/21/16 0830 10/21/16 0900 10/21/16 0926 10/21/16 1000  BP: 157/82 146/82 142/82 112/77  Pulse: 94 89 90 82  Resp: (!) 27 24 24 22   Temp:      TempSrc:      SpO2: 94% 92% 94% 96%  Weight:      Height:         General: mild distress on BIPAP, resting in bed Eyes:  PERRL, EOMI, normal lids, iris ENT:  grossly normal hearing, lips & tongue, mmm Neck:  no LAD, masses or thyromegaly Cardiovascular:  RRR, difficult to appreciate cardiac sounds due to patient being on BiPAP and with COPD exacerbation. Faint 2/6 systolic murmur appreciated. 1+ bilateral lower extremity pitting edema.  Respiratory: Wheezing and rhonchorous breath sounds throughout. On BiPAP. Abdomen:  soft, ntnd, NABS Skin:  Bilateral venous stasis dermatitis on the dorsum of her feet. No induration or tenderness to palpation.  Musculoskeletal:  grossly normal tone BUE/BLE, good ROM, no bony abnormality Psychiatric:  grossly normal mood and affect, speech  fluent and appropriate, AOx3 Neurologic:  CN 2-12 grossly intact, moves all extremities in coordinated fashion, sensation intact  Labs on Admission: I have personally reviewed following labs and imaging studies  CBC:  Recent Labs Lab 10/21/16 0455  WBC 5.5  HGB 13.0  HCT 38.4  MCV 90.4  PLT 273   Basic Metabolic Panel:  Recent Labs Lab 10/21/16 0455  NA 129*  K 3.7  CL 84*  CO2 35*  GLUCOSE 93  BUN 15  CREATININE 0.65  CALCIUM 9.2   GFR: Estimated Creatinine Clearance: 61.6 mL/min (by C-G formula based on SCr of 0.65 mg/dL). Liver Function Tests: No results for input(s): AST, ALT, ALKPHOS, BILITOT, PROT, ALBUMIN in the last 168 hours. No results for input(s): LIPASE, AMYLASE in the last 168 hours. No results for input(s): AMMONIA in  the last 168 hours. Coagulation Profile: No results for input(s): INR, PROTIME in the last 168 hours. Cardiac Enzymes:  Recent Labs Lab 10/21/16 0455  TROPONINI <0.03   BNP (last 3 results) No results for input(s): PROBNP in the last 8760 hours. HbA1C: No results for input(s): HGBA1C in the last 72 hours. CBG: No results for input(s): GLUCAP in the last 168 hours. Lipid Profile: No results for input(s): CHOL, HDL, LDLCALC, TRIG, CHOLHDL, LDLDIRECT in the last 72 hours. Thyroid Function Tests: No results for input(s): TSH, T4TOTAL, FREET4, T3FREE, THYROIDAB in the last 72 hours. Anemia Panel: No results for input(s): VITAMINB12, FOLATE, FERRITIN, TIBC, IRON, RETICCTPCT in the last 72 hours. Urine analysis: No results found for: COLORURINE, APPEARANCEUR, LABSPEC, PHURINE, GLUCOSEU, HGBUR, BILIRUBINUR, KETONESUR, PROTEINUR, UROBILINOGEN, NITRITE, LEUKOCYTESUR  Creatinine Clearance: Estimated Creatinine Clearance: 61.6 mL/min (by C-G formula based on SCr of 0.65 mg/dL).  Sepsis Labs: @LABRCNTIP (procalcitonin:4,lacticidven:4) )No results found for this or any previous visit (from the past 240 hour(s)).   Radiological Exams on  Admission: Dg Chest 2 View  Result Date: 10/21/2016 CLINICAL DATA:  Increasing shortness of breath for 3 days. History of COPD. EXAM: CHEST  2 VIEW COMPARISON:  None. FINDINGS: Mild cardiac enlargement with normal pulmonary vascularity. Emphysematous changes and scattered fibrosis in the lungs. No focal airspace disease or consolidation. No blunting of costophrenic angles. No pneumothorax. Calcified and tortuous aorta. Degenerative changes in the spine. IMPRESSION: Cardiac enlargement. Emphysematous changes in the lungs. No evidence of active pulmonary disease. Electronically Signed   By: Burman NievesWilliam  Stevens M.D.   On: 10/21/2016 05:24    EKG: Pending  Assessment/Plan Active Problems:   COPD exacerbation (HCC)   Acute on chronic respiratory failure (HCC)   Chest tightness   Essential hypertension   Hypothyroidism   Depression   RLS (restless legs syndrome)   Acute on chronic respiratory failure: Likely secondary to COPD exacerbation. Patient with baseline 4 L nasal cannula use at home. Requiring BiPAP in order to maintain normal O2 sat duration. ABG showing pH of 7.38, PCO2 60.5, PO2 66 and bicarbonate 38. Suspect patient is a chronic CO2 retainer. Started on Doxy and prednisone 2 days prior to arrival. CXR without evidence of infiltrate or significant CHF with vascular congestion. - Wean BiPAP as able (baseline 4 L nasal cannula) - Solu-Medrol 60 mg every 8 hours - DuoNeb every 4 hours - Mucinex - Respiratory viral panel - Levaquin IV - Pro calcitonin - Continue Dulera, spiriva  Chest tightness: likely from COPD exacerbation. Initial trop nml. No EKG - EKG - Tele - cycle trop - Echo (cardiomegaly on CXR w/ LE edema - no h/o same in care everywhere)  Hyponatremia: 129 - gentle IVF - BMET in am  HTN:  - continue CHlorthalidone, Atenolol  Hypothyroidism: - continue synthroid  Depression/RLS: - continue Celexa, Mirapex  DVT prophylaxis: Lovenox Code Status: DNR - confirmed  w/ pt  Family Communication: Son  Disposition Plan: pending improvement   Consults called: none  Admission status: Observation    Jahlia Omura J MD [facility]  If 7PM-7AM, please contact night-coverage [email or web address] Password [email or web address]  10/21/2016, 10:37 AM

## 2016-10-22 ENCOUNTER — Observation Stay (HOSPITAL_BASED_OUTPATIENT_CLINIC_OR_DEPARTMENT_OTHER): Payer: Medicare Other

## 2016-10-22 ENCOUNTER — Encounter: Payer: Self-pay | Admitting: Adult Health

## 2016-10-22 DIAGNOSIS — E222 Syndrome of inappropriate secretion of antidiuretic hormone: Secondary | ICD-10-CM | POA: Diagnosis present

## 2016-10-22 DIAGNOSIS — J9622 Acute and chronic respiratory failure with hypercapnia: Secondary | ICD-10-CM | POA: Diagnosis present

## 2016-10-22 DIAGNOSIS — J9621 Acute and chronic respiratory failure with hypoxia: Secondary | ICD-10-CM | POA: Diagnosis present

## 2016-10-22 DIAGNOSIS — G4733 Obstructive sleep apnea (adult) (pediatric): Secondary | ICD-10-CM | POA: Diagnosis present

## 2016-10-22 DIAGNOSIS — F329 Major depressive disorder, single episode, unspecified: Secondary | ICD-10-CM | POA: Diagnosis present

## 2016-10-22 DIAGNOSIS — F419 Anxiety disorder, unspecified: Secondary | ICD-10-CM | POA: Diagnosis present

## 2016-10-22 DIAGNOSIS — R0789 Other chest pain: Secondary | ICD-10-CM | POA: Diagnosis not present

## 2016-10-22 DIAGNOSIS — Z9981 Dependence on supplemental oxygen: Secondary | ICD-10-CM | POA: Diagnosis not present

## 2016-10-22 DIAGNOSIS — E785 Hyperlipidemia, unspecified: Secondary | ICD-10-CM | POA: Diagnosis present

## 2016-10-22 DIAGNOSIS — I517 Cardiomegaly: Secondary | ICD-10-CM

## 2016-10-22 DIAGNOSIS — G2581 Restless legs syndrome: Secondary | ICD-10-CM | POA: Diagnosis present

## 2016-10-22 DIAGNOSIS — Z885 Allergy status to narcotic agent status: Secondary | ICD-10-CM | POA: Diagnosis not present

## 2016-10-22 DIAGNOSIS — Z96652 Presence of left artificial knee joint: Secondary | ICD-10-CM | POA: Diagnosis present

## 2016-10-22 DIAGNOSIS — Z66 Do not resuscitate: Secondary | ICD-10-CM | POA: Diagnosis not present

## 2016-10-22 DIAGNOSIS — L4050 Arthropathic psoriasis, unspecified: Secondary | ICD-10-CM | POA: Diagnosis present

## 2016-10-22 DIAGNOSIS — Z8249 Family history of ischemic heart disease and other diseases of the circulatory system: Secondary | ICD-10-CM | POA: Diagnosis not present

## 2016-10-22 DIAGNOSIS — Z87891 Personal history of nicotine dependence: Secondary | ICD-10-CM | POA: Diagnosis not present

## 2016-10-22 DIAGNOSIS — B974 Respiratory syncytial virus as the cause of diseases classified elsewhere: Secondary | ICD-10-CM | POA: Diagnosis present

## 2016-10-22 DIAGNOSIS — Z515 Encounter for palliative care: Secondary | ICD-10-CM | POA: Diagnosis not present

## 2016-10-22 DIAGNOSIS — I10 Essential (primary) hypertension: Secondary | ICD-10-CM | POA: Diagnosis present

## 2016-10-22 DIAGNOSIS — Z79899 Other long term (current) drug therapy: Secondary | ICD-10-CM | POA: Diagnosis not present

## 2016-10-22 DIAGNOSIS — I739 Peripheral vascular disease, unspecified: Secondary | ICD-10-CM | POA: Diagnosis present

## 2016-10-22 DIAGNOSIS — Z7189 Other specified counseling: Secondary | ICD-10-CM | POA: Diagnosis not present

## 2016-10-22 DIAGNOSIS — E039 Hypothyroidism, unspecified: Secondary | ICD-10-CM | POA: Diagnosis present

## 2016-10-22 DIAGNOSIS — J441 Chronic obstructive pulmonary disease with (acute) exacerbation: Secondary | ICD-10-CM | POA: Diagnosis present

## 2016-10-22 DIAGNOSIS — Z7952 Long term (current) use of systemic steroids: Secondary | ICD-10-CM | POA: Diagnosis not present

## 2016-10-22 DIAGNOSIS — Z888 Allergy status to other drugs, medicaments and biological substances status: Secondary | ICD-10-CM | POA: Diagnosis not present

## 2016-10-22 LAB — CBC
HEMATOCRIT: 39.5 % (ref 36.0–46.0)
HEMOGLOBIN: 13.2 g/dL (ref 12.0–15.0)
MCH: 30.4 pg (ref 26.0–34.0)
MCHC: 33.4 g/dL (ref 30.0–36.0)
MCV: 91 fL (ref 78.0–100.0)
Platelets: 270 10*3/uL (ref 150–400)
RBC: 4.34 MIL/uL (ref 3.87–5.11)
RDW: 12.7 % (ref 11.5–15.5)
WBC: 4.8 10*3/uL (ref 4.0–10.5)

## 2016-10-22 LAB — BASIC METABOLIC PANEL
ANION GAP: 8 (ref 5–15)
BUN: 21 mg/dL — ABNORMAL HIGH (ref 6–20)
CHLORIDE: 80 mmol/L — AB (ref 101–111)
CO2: 38 mmol/L — AB (ref 22–32)
Calcium: 9.2 mg/dL (ref 8.9–10.3)
Creatinine, Ser: 0.68 mg/dL (ref 0.44–1.00)
GFR calc non Af Amer: >60 mL/min (ref >60)
Glucose, Bld: 132 mg/dL — ABNORMAL HIGH (ref 65–99)
POTASSIUM: 4.2 mmol/L (ref 3.5–5.1)
SODIUM: 126 mmol/L — AB (ref 135–145)

## 2016-10-22 LAB — ECHOCARDIOGRAM COMPLETE
HEIGHTINCHES: 66 in
Weight: 3248 oz

## 2016-10-22 LAB — OSMOLALITY: OSMOLALITY: 274 mosm/kg — AB (ref 275–295)

## 2016-10-22 LAB — SODIUM, URINE, RANDOM: Sodium, Ur: 67 mmol/L

## 2016-10-22 LAB — OSMOLALITY, URINE: Osmolality, Ur: 818 mOsm/kg (ref 300–900)

## 2016-10-22 MED ORDER — PRAMIPEXOLE DIHYDROCHLORIDE 1 MG PO TABS
1.5000 mg | ORAL_TABLET | Freq: Every day | ORAL | Status: DC
  Administered 2016-10-23 – 2016-10-28 (×6): 1.5 mg via ORAL
  Filled 2016-10-22 (×8): qty 2

## 2016-10-22 MED ORDER — PERFLUTREN LIPID MICROSPHERE
1.0000 mL | INTRAVENOUS | Status: AC | PRN
  Administered 2016-10-22: 2 mL via INTRAVENOUS
  Filled 2016-10-22: qty 10

## 2016-10-22 MED ORDER — BUDESONIDE 0.25 MG/2ML IN SUSP
0.2500 mg | Freq: Two times a day (BID) | RESPIRATORY_TRACT | Status: DC
  Administered 2016-10-22 – 2016-10-29 (×14): 0.25 mg via RESPIRATORY_TRACT
  Filled 2016-10-22 (×15): qty 2

## 2016-10-22 MED ORDER — PERFLUTREN LIPID MICROSPHERE
INTRAVENOUS | Status: AC
  Filled 2016-10-22: qty 10

## 2016-10-22 MED ORDER — IPRATROPIUM-ALBUTEROL 0.5-2.5 (3) MG/3ML IN SOLN
3.0000 mL | RESPIRATORY_TRACT | Status: DC
  Administered 2016-10-22 – 2016-10-27 (×31): 3 mL via RESPIRATORY_TRACT
  Filled 2016-10-22 (×33): qty 3

## 2016-10-22 MED ORDER — PRAMIPEXOLE DIHYDROCHLORIDE 0.25 MG PO TABS
0.7500 mg | ORAL_TABLET | Freq: Every day | ORAL | Status: DC
  Administered 2016-10-24 – 2016-10-29 (×6): 0.75 mg via ORAL
  Filled 2016-10-22 (×8): qty 3

## 2016-10-22 MED ORDER — ARFORMOTEROL TARTRATE 15 MCG/2ML IN NEBU
15.0000 ug | INHALATION_SOLUTION | Freq: Two times a day (BID) | RESPIRATORY_TRACT | Status: DC
  Administered 2016-10-22 – 2016-10-29 (×14): 15 ug via RESPIRATORY_TRACT
  Filled 2016-10-22 (×15): qty 2

## 2016-10-22 NOTE — Plan of Care (Addendum)
Problem: Pain Managment: Goal: General experience of comfort will improve Outcome: Progressing Pt stated she needs her nightly Mirapex medication for pain related to restless leg syndrome, otherwise she can not sleep. On call notified and new order for Mirapex obtained and given per order. Will continue to monitor pt's pain.   Problem: Tissue Perfusion: Goal: Risk factors for ineffective tissue perfusion will decrease Outcome: Progressing Pt stated that she" felt anxious with bipap on".  On call notified. Ordered for 0.5 mg IV ativan while bipap on.  Med given per order and pt anxiety appeared to decrease. Pt maintained O2 above 92% on bipap throughout the shift and hourly rounding was performed, continuous O2 monitoring and telemetry. Pt is currently resting in bed and states she has no other needs or concerns at this time.  Will continue to monitor and assess pt needs.

## 2016-10-22 NOTE — Care Management Note (Addendum)
Case Management Note  Patient Details  Name: Teresa Weiss MRN: [id number] Date of Birth: 1933-08-27  Subjective/Objective: Pt presented for COPD Exacerbation. Pt initiated on IV Solumedrol and BIPAP. Pt is from [place] [facility] per son. Pt has DME Liquid Oxygen and Rw via [facility] through the [place]. Pt utilizes the [place]. Pt will benefit from PT/OT consult to see if needs higher level of care before d/c.                Action/Plan: CM did reach out to [facility] in regards to BIPAP. CM awaiting call back. CM will continue to monitor for additional needs.   Expected Discharge Date:                  Expected Discharge Plan:  Home w Home Health Services (Idependent Living Facility)  In-House Referral:  NA  Discharge planning Services  CM Consult  Post Acute Care Choice:  Durable Medical Equipment Choice offered to:  Adult Children, Patient  DME Arranged: N/A DME Agency:   N/A  HH Arranged:   PT, Nurses Aide HH Agency:   [facility]  Status of Service: Completed If discussed at Long Length of Stay Meetings, dates discussed:  10-26-16,   Additional Comments: 10-29-16 1112 10-29-16 Tomi Bamberger, RN,BSN [phone] CM did speak with MD in regards to after pt ambulated. Sats decreased to 77% ambulating. Pt is on 4L at rest at home and 6L with ambulation. MD ordered Oximyzer for home. Pt and son refused. [facility] had [facility] to [facility] O[facility] in the past and pt did not like it. Son stated that she will be fine with the 6L with ambulation and 4L at rest for home regimen. CM did speak with Staff RN in regards and made MD aware. No further needs from CM at this time.     1034 10-29-16 Tomi Bamberger, RN,BSN [phone] Insurance denied CIR. Pt still agreeable to return to [place] and to have [facility] to provide PT and [employer]. Referral made to [facility] in regards to [facility]. SOC to begin within 24-48 hours post d/c. 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 Pt aware to contact PCP if RN services to be needed post d/c. Pt has DME liquid 02 may need liter flow increase - RN to assess, RW and CPAP at home. No BIPAP needed at this time. CM did discuss transport home and son will transport via car. Pt has liquid 02 for travel. Family to utilize Personal [facility] Services for additional care in the home. No further needs from CM at this time.    1609 10-28-16 Tomi Bamberger, RN,BSN [phone] CM did speak with pt and son in regards to disposition needs just in case insurance did not authorize for CIR. Plan will be to return to [place] with [employer]. Agency List provided to patient and both chose [facility] in regards to PT Services. No referral made at this time. CM did discuss option of RN and pt will think about it. Private Duty List provided to family as well - family aware that PC services will be an out of pocket expense. CM awaiting to see if insurance approves CIR. Will continue to monitor.    10-25-16 [address] [place] [phone] Palliative Care following. IV Solumedrol to be restarted. Pt was initially from [facility]. CM will continue to monitor if patient will need SNF vs Residential Facility.    1556 10-22-16 Tomi Bamberger, RN,  BSN [id number] Maggie [phone] ext [phone] [facility]. If pt continues to need BIPAP at d/c- Please fax orders to Dr. Marina GoodellPerry at [phone] send information about blood gas and actual order for BIPAP. [facility] will be closed on Monday to reopen on Tuesday. If pt d/c before Tuesday and not able [place] get [facility] pt can use insurance [facility] [place] and [place] [facility] [place]. CM will continue to monitor for additional needs.  Gala LewandowskyGraves-Bigelow, Juanita Devincent Kaye, RN 10/22/2016, 3:10 PM

## 2016-10-22 NOTE — Progress Notes (Signed)
  Echocardiogram 2D Echocardiogram has been performed with definity.  Teresa Weiss 10/22/2016, 4:16 PM

## 2016-10-22 NOTE — Progress Notes (Signed)
Patient placed back on bipap for increased work of breathing. I sent a text page to Dr. Isidoro Donningai and called her son to give him a courtesy call.

## 2016-10-22 NOTE — Progress Notes (Signed)
Pharmacy Antibiotic Note  Janifer AdieMarlene Evonne Boughton Griselda MinerUrton is a 83 y.o. female admitted on 10/21/2016 with COPD exacerbation. Also noted RSV +. Pharmacy has been consulted for levaquin dosing - day #2. SCr stable WNL, CrCl~60-65.  Plan: Levaquin 750mg  IV q24h Monitor culture data, renal function, and LOT  Height: 5\' 6"  (167.6 cm) Weight: 203 lb (92.1 kg) IBW/kg (Calculated) : 59.3  Temp (24hrs), Avg:98.1 F (36.7 C), Min:97.8 F (36.6 C), Max:98.3 F (36.8 C)   Recent Labs Lab 10/21/16 0455 10/22/16 0555  WBC 5.5 4.8  CREATININE 0.65 0.68    Estimated Creatinine Clearance: 63 mL/min (by C-G formula based on SCr of 0.68 mg/dL).    Allergies  Allergen Reactions  . Infliximab Shortness Of Breath    Respiratory complications.  . Vicodin [Hydrocodone-Acetaminophen] Nausea And Vomiting    Babs BertinHaley Tonya Wantz, PharmD, BCPS Clinical Pharmacist 10/22/2016 10:56 AM

## 2016-10-22 NOTE — Progress Notes (Signed)
[facility]                                                                              Patient Demographics  Teresa Weiss, is a 83 y.o. female, DOB - 1933-04-05, ZOX:[id number]  Admit date - 10/21/2016   Admitting Physician Ozella Rocks, MD  Outpatient Primary MD for the patient is Shirline Frees, NP  Outpatient specialists:   LOS - 0  days    Chief Complaint  Patient presents with  . Shortness of Breath       Brief summary   Patient is an 83 year old female with chronic respiratory failure, oxygen dependent COPD, OSA on CPAP at night, hypertension, hyperlipidemia, presented with 3 four-day history of worsening shortness of breath, coughing, wheezing. Patient was started on doxycycline 2 days ago with prednisone. At baseline on 4 L and sometimes increases to 6 L on exertion. In ED, patient was placed on Solu-Medrol, BiPAP.  Assessment & Plan      Acute on chronic respiratory failure: Likely secondary to COPD exacerbation. - Advised baseline on 4 L O2 via nasal cannula and 6 L on exertion - On BiPAP at the time of my examination -   ABG showing pH of 7.38, PCO2 60.5, PO2 66 and bicarbonate 38. Suspect patient is a chronic CO2 retainer.  - Continue IV Solu-Medrol, IV levofloxacin -Placed on scheduled duonebs, Pulmicort, Brovana Mucinex,   - respiratory virus panel showed respiratory syncytial virus positive  - Pro calcitonin less than 0.1, BNP 82.4  Chest tightness: likely from COPD exacerbation. Initial trop nml. No EKG - troponins negative x2, EKG - Echo (cardiomegaly on CXR w/ LE edema - no h/o same in care everywhere)  Hyponatremia: 129 - Patient was placed on gentle hydration at the time of admission, sodium trending down, hold IV fluids - Possibly can have SIADH from pulmonary issues, obtain serum osmolarity, urine osmolarity, U Na - Hold chlorthalidone, Celexa  - Bmet in am, TSH  HTN:  - continue Atenolol - Hold the chlorthalidone    Hypothyroidism: - continue synthroid, TSH  Depression/RLS: - continue Mirapex  Code Status: DNR DVT Prophylaxis:  Lovenox Family Communication: Discussed in detail with the patient, all imaging results, lab results explained to the patient and grandson at bedside    Disposition Plan:  Time Spent in minutes   25  minutes  Procedures:  On BiPAP   Consultants:  None  Antimicrobials :    IV Levaquin 1/11    Medications  Scheduled Meds: . arformoterol  15 mcg Nebulization BID  . atenolol  25 mg Oral QHS  . budesonide (PULMICORT) nebulizer solution  0.25 mg Nebulization BID  . chlorthalidone  25 mg Oral Daily  . citalopram  20 mg Oral Daily  . enoxaparin (LOVENOX) injection  40 mg Subcutaneous Q24H  . guaiFENesin  1,200 mg Oral BID  . ipratropium-albuterol  3 mL Nebulization Q4H  . levofloxacin (LEVAQUIN) IV  750 mg Intravenous Q24H  . levothyroxine  150 mcg Oral Daily  . methylPREDNISolone (SOLU-MEDROL) injection  60 mg Intravenous Q8H  . mometasone-formoterol  2 puff Inhalation BID  . pramipexole  0.75 mg Oral Q1200   And  . pramipexole  1.5 mg Oral QHS  . pramipexole  1.5 mg Oral QHS  . sodium chloride flush  3 mL Intravenous Q12H  . sodium chloride flush  3 mL Intravenous Q12H  . tiotropium  18 mcg Inhalation Daily   Continuous Infusions: . sodium chloride 75 mL/hr at 10/21/16 1212  . albuterol Stopped (10/21/16 0835)   PRN Meds:.sodium chloride, acetaminophen **OR** acetaminophen, gi cocktail, LORazepam, morphine injection, ondansetron **OR** ondansetron (ZOFRAN) IV, sodium chloride flush   Antibiotics   Anti-infectives    Start     Dose/Rate Route Frequency Ordered Stop   10/21/16 1100  levofloxacin (LEVAQUIN) IVPB 750 mg     750 mg 100 mL/hr over 90 Minutes Intravenous Every 24 hours 10/21/16 1037          Subjective:   Teresa Weiss was seen and examined today. At the time of my examination on BiPAP, difficult to obtain review of systems  from the patient. No fevers or chills   Objective:   Vitals:   10/22/16 0340 10/22/16 0500 10/22/16 0633 10/22/16 0648  BP: 134/86 (!) 158/78 (!) 174/91 135/81  Pulse: 77 70 78   Resp: (!) 24 20 19  (!) 22  Temp: 97.8 F (36.6 C)     TempSrc: Axillary     SpO2: 96% 99% 98% 99%  Weight: 92.1 kg (203 lb)     Height:        Intake/Output Summary (Last 24 hours) at 10/22/16 1135 Last data filed at 10/22/16 0409  Gross per 24 hour  Intake              150 ml  Output             1150 ml  Net            -1000 ml     Wt Readings from Last 3 Encounters:  10/22/16 92.1 kg (203 lb)  10/07/16 91.7 kg (202 lb 4 oz)     Exam  General: Alert And awake on BiPAP  HEENT:  PERRLA, EOMI  Neck:  Cardiovascular: S1 S2 auscultated, no rubs, murmurs or gallops. Regular rate and rhythm.  Respiratory: Scattered wheezing bilaterally   Gastrointestinal: Soft, nontender, nondistended, + bowel sounds  Ext: no cyanosis clubbing, trace edema  Neuro: AAOx3, Cr N's II- XII. Strength 5/5 upper and lower extremities bilaterally  Skin: No rashes  Psych: Normal affect and demeanor, alert and oriented x3    Data Reviewed:  I have personally reviewed following labs and imaging studies  Micro Results Recent Results (from the past 240 hour(s))  Respiratory Panel by PCR     Status: Abnormal   Collection Time: 10/21/16 10:34 AM  Result Value Ref Range Status   Adenovirus NOT DETECTED NOT DETECTED Final   Coronavirus 229E NOT DETECTED NOT DETECTED Final   Coronavirus HKU1 NOT DETECTED NOT DETECTED Final   Coronavirus NL63 NOT DETECTED NOT DETECTED Final   Coronavirus OC43 NOT DETECTED NOT DETECTED Final   Metapneumovirus NOT DETECTED NOT DETECTED Final   Rhinovirus / Enterovirus NOT DETECTED NOT DETECTED Final   Influenza A NOT DETECTED NOT DETECTED Final   Influenza B NOT DETECTED NOT DETECTED Final   Parainfluenza Virus 1 NOT DETECTED NOT DETECTED Final   Parainfluenza Virus 2 NOT  DETECTED NOT DETECTED Final   Parainfluenza Virus 3 NOT DETECTED NOT DETECTED Final   Parainfluenza Virus 4 NOT DETECTED NOT DETECTED Final   Respiratory  Syncytial Virus DETECTED (A) NOT DETECTED Final    Comment: CRITICAL RESULT CALLED TO, READ BACK BY AND VERIFIED WITH: F TARPLEY RN 1858 10/21/16 A BROWNING    Bordetella pertussis NOT DETECTED NOT DETECTED Final   Chlamydophila pneumoniae NOT DETECTED NOT DETECTED Final   Mycoplasma pneumoniae NOT DETECTED NOT DETECTED Final    Radiology Reports Dg Chest 2 View  Result Date: 10/21/2016 CLINICAL DATA:  Increasing shortness of breath for 3 days. History of COPD. EXAM: CHEST  2 VIEW COMPARISON:  None. FINDINGS: Mild cardiac enlargement with normal pulmonary vascularity. Emphysematous changes and scattered fibrosis in the lungs. No focal airspace disease or consolidation. No blunting of costophrenic angles. No pneumothorax. Calcified and tortuous aorta. Degenerative changes in the spine. IMPRESSION: Cardiac enlargement. Emphysematous changes in the lungs. No evidence of active pulmonary disease. Electronically Signed   By: Burman Nieves M.D.   On: 10/21/2016 05:24    Lab Data:  CBC:  Recent Labs Lab 10/21/16 0455 10/22/16 0555  WBC 5.5 4.8  HGB 13.0 13.2  HCT 38.4 39.5  MCV 90.4 91.0  PLT 273 270   Basic Metabolic Panel:  Recent Labs Lab 10/21/16 0455 10/22/16 0555  NA 129* 126*  K 3.7 4.2  CL 84* 80*  CO2 35* 38*  GLUCOSE 93 132*  BUN 15 21*  CREATININE 0.65 0.68  CALCIUM 9.2 9.2   GFR: Estimated Creatinine Clearance: 63 mL/min (by C-G formula based on SCr of 0.68 mg/dL). Liver Function Tests: No results for input(s): AST, ALT, ALKPHOS, BILITOT, PROT, ALBUMIN in the last 168 hours. No results for input(s): LIPASE, AMYLASE in the last 168 hours. No results for input(s): AMMONIA in the last 168 hours. Coagulation Profile: No results for input(s): INR, PROTIME in the last 168 hours. Cardiac Enzymes:  Recent  Labs Lab 10/21/16 0455 10/21/16 1037 10/21/16 1723  TROPONINI <0.03 <0.03 <0.03   BNP (last 3 results) No results for input(s): PROBNP in the last 8760 hours. HbA1C: No results for input(s): HGBA1C in the last 72 hours. CBG: No results for input(s): GLUCAP in the last 168 hours. Lipid Profile: No results for input(s): CHOL, HDL, LDLCALC, TRIG, CHOLHDL, LDLDIRECT in the last 72 hours. Thyroid Function Tests: No results for input(s): TSH, T4TOTAL, FREET4, T3FREE, THYROIDAB in the last 72 hours. Anemia Panel: No results for input(s): VITAMINB12, FOLATE, FERRITIN, TIBC, IRON, RETICCTPCT in the last 72 hours. Urine analysis: No results found for: COLORURINE, APPEARANCEUR, LABSPEC, PHURINE, GLUCOSEU, HGBUR, BILIRUBINUR, KETONESUR, PROTEINUR, UROBILINOGEN, NITRITE, Hurshel Party M.D. [facility] 10/22/2016, 11:35 AM  Pager: [phone] Between 7am to 7pm - call Pager - [phone]  After 7pm go to [email or web address] - password [email or web address]  Call night coverage person covering after 7pm

## 2016-10-22 NOTE — Progress Notes (Signed)
I took patient off of bipap and placed on 6l [place]. Tolerating well. Will closely monitor. Son is at bedside

## 2016-10-23 LAB — PROCALCITONIN: Procalcitonin: <0.1 ng/mL

## 2016-10-23 LAB — CBC
HEMATOCRIT: 38.1 % (ref 36.0–46.0)
Hemoglobin: 12.3 g/dL (ref 12.0–15.0)
MCH: 30.2 pg (ref 26.0–34.0)
MCHC: 32.3 g/dL (ref 30.0–36.0)
MCV: 93.6 fL (ref 78.0–100.0)
Platelets: 241 10*3/uL (ref 150–400)
RBC: 4.07 MIL/uL (ref 3.87–5.11)
RDW: 13.2 % (ref 11.5–15.5)
WBC: 4.3 10*3/uL (ref 4.0–10.5)

## 2016-10-23 LAB — BASIC METABOLIC PANEL
Anion gap: 7 (ref 5–15)
BUN: 25 mg/dL — AB (ref 6–20)
CHLORIDE: 80 mmol/L — AB (ref 101–111)
CO2: 38 mmol/L — AB (ref 22–32)
CREATININE: 0.68 mg/dL (ref 0.44–1.00)
Calcium: 8.9 mg/dL (ref 8.9–10.3)
GFR calc Af Amer: >60 mL/min (ref >60)
GFR calc non Af Amer: >60 mL/min (ref >60)
GLUCOSE: 125 mg/dL — AB (ref 65–99)
POTASSIUM: 4.4 mmol/L (ref 3.5–5.1)
Sodium: 125 mmol/L — ABNORMAL LOW (ref 135–145)

## 2016-10-23 LAB — TSH: TSH: 1.948 u[IU]/mL (ref 0.350–4.500)

## 2016-10-23 MED ORDER — LEVOFLOXACIN 750 MG PO TABS
750.0000 mg | ORAL_TABLET | Freq: Every day | ORAL | Status: DC
  Administered 2016-10-23 – 2016-10-28 (×6): 750 mg via ORAL
  Filled 2016-10-23 (×6): qty 1

## 2016-10-23 MED ORDER — SODIUM CHLORIDE 0.9 % IV SOLN: INTRAVENOUS | Status: DC | PRN

## 2016-10-23 NOTE — Progress Notes (Addendum)
PROGRESS NOTE    Teresa Weiss  ZOX:[id number] DOB: Nov 16, 1932 DOA: 10/21/2016 PCP: Shirline Frees, NP     Brief Narrative:  Teresa Weiss is a 83 yo female with PMHx of chronic respiratory failure, oxygen dependent COPD, OSA on CPAP at night, hypertension, hyperlipidemia, presented with 3 four-day history of worsening shortness of breath, coughing, wheezing. Upon presentation to the hospital, patient was placed on BiPAP for acute on chronic respiratory failure.  Assessment & Plan:   Active Problems:   COPD exacerbation (HCC)   Acute on chronic respiratory failure (HCC)   Chest tightness   Essential hypertension   Hypothyroidism   Depression   RLS (restless legs syndrome)   Acute on chronic hypoxemic and hypercapnic respiratory failure secondary to COPD exacerbation and RSV  - Baseline on 4 L O2 at rest and 6 L on exertion - Off Bipap this morning, but suspect may require bipap again later today  - Continue IV Solu-Medrol, levofloxacin, nebs   Chest tightness: likely from COPD exacerbation  - Echo: EF 60-65%, poor image quality no apical views. Cosider f/u TEE or MRI Aorta is moderately dilated and should have f/u imaging as outpatient.  - No chest tightness or pain on exam today. Monitor.   Hyponatremia  - Patient was placed on gentle hydration at the time of admission, sodium trending down, hold IV fluids. Urine studies consistent with SIADH. Urine osmol 818, urine Na 67.  - Hold chlorthalidone, Celexa  - Fluid restriction - Trend BMP   HTN:  - Continue Atenolol - Hold the chlorthalidone due to hyponatremia   Hypothyroidism: - Continue synthroid  Depression/RLS: - Continue Mirapex   DVT prophylaxis: lovenox Code Status: DNR Family Communication: son at bedside Disposition Plan: pending further improvement. Palliative care consulted today at patient's son's request for further discussion of goals of care.    Consultants:    Palliative care  Procedures:   None  Antimicrobials:  Anti-infectives    Start     Dose/Rate Route Frequency Ordered Stop   10/23/16 1100  levofloxacin (LEVAQUIN) tablet 750 mg     750 mg Oral Daily 10/23/16 0708     10/21/16 1100  levofloxacin (LEVAQUIN) IVPB 750 mg  Status:  Discontinued     750 mg 100 mL/hr over 90 Minutes Intravenous Every 24 hours 10/21/16 1037 10/23/16 0708       Subjective: Patient was taken off BiPAP this morning. On my exam, tolerating nasal cannula without distress although has some conversational dyspnea. No new complaints this morning  Objective: Vitals:   10/23/16 0741 10/23/16 0744 10/23/16 0748 10/23/16 0954  BP:   129/70   Pulse:   64   Resp:   (!) 25   Temp:   98.1 F (36.7 C)   TempSrc:   Axillary   SpO2: 92% 92% 92% 100%  Weight:      Height:        Intake/Output Summary (Last 24 hours) at 10/23/16 1131 Last data filed at 10/23/16 1056  Gross per 24 hour  Intake              180 ml  Output              750 ml  Net             -570 ml   Filed Weights   10/21/16 0452 10/22/16 0340  Weight: 91.6 kg (202 lb) 92.1 kg (203 lb)    Examination:  General  exam: Appears calm and comfortable  Respiratory system: diminished breath sounds, minimal wheezing, on [place] O2  Cardiovascular system: S1 & S2 heard, RRR. No JVD, murmurs, rubs, gallops or clicks. No pedal edema. Gastrointestinal system: Abdomen is nondistended, soft and nontender. No organomegaly or masses felt. Normal bowel sounds heard. Central nervous system: Alert and oriented. No focal neurological deficits. Extremities: Symmetric 5 x 5 power. Skin: No rashes, lesions or ulcers Psychiatry: Judgement and insight appear normal. Mood & affect appropriate.   Data Reviewed: I have personally reviewed following labs and imaging studies  CBC:  Recent Labs Lab 10/21/16 0455 10/22/16 0555 10/23/16 0620  WBC 5.5 4.8 4.3  HGB 13.0 13.2 12.3  HCT 38.4 39.5 38.1  MCV 90.4 91.0  93.6  PLT 273 270 241   Basic Metabolic Panel:  Recent Labs Lab 10/21/16 0455 10/22/16 0555 10/23/16 0620  NA 129* 126* 125*  K 3.7 4.2 4.4  CL 84* 80* 80*  CO2 35* 38* 38*  GLUCOSE 93 132* 125*  BUN 15 21* 25*  CREATININE 0.65 0.68 0.68  CALCIUM 9.2 9.2 8.9   GFR: Estimated Creatinine Clearance: 63 mL/min (by C-G formula based on SCr of 0.68 mg/dL). Liver Function Tests: No results for input(s): AST, ALT, ALKPHOS, BILITOT, PROT, ALBUMIN in the last 168 hours. No results for input(s): LIPASE, AMYLASE in the last 168 hours. No results for input(s): AMMONIA in the last 168 hours. Coagulation Profile: No results for input(s): INR, PROTIME in the last 168 hours. Cardiac Enzymes:  Recent Labs Lab 10/21/16 0455 10/21/16 1037 10/21/16 1723  TROPONINI <0.03 <0.03 <0.03   BNP (last 3 results) No results for input(s): PROBNP in the last 8760 hours. HbA1C: No results for input(s): HGBA1C in the last 72 hours. CBG: No results for input(s): GLUCAP in the last 168 hours. Lipid Profile: No results for input(s): CHOL, HDL, LDLCALC, TRIG, CHOLHDL, LDLDIRECT in the last 72 hours. Thyroid Function Tests:  Recent Labs  10/23/16 0620  TSH 1.948   Anemia Panel: No results for input(s): VITAMINB12, FOLATE, FERRITIN, TIBC, IRON, RETICCTPCT in the last 72 hours. Sepsis Labs:  Recent Labs Lab 10/21/16 1037 10/23/16 0620  PROCALCITON <0.10 <0.10    Recent Results (from the past 240 hour(s))  Respiratory Panel by PCR     Status: Abnormal   Collection Time: 10/21/16 10:34 AM  Result Value Ref Range Status   Adenovirus NOT DETECTED NOT DETECTED Final   Coronavirus 229E NOT DETECTED NOT DETECTED Final   Coronavirus HKU1 NOT DETECTED NOT DETECTED Final   Coronavirus NL63 NOT DETECTED NOT DETECTED Final   Coronavirus OC43 NOT DETECTED NOT DETECTED Final   Metapneumovirus NOT DETECTED NOT DETECTED Final   Rhinovirus / Enterovirus NOT DETECTED NOT DETECTED Final   Influenza A  NOT DETECTED NOT DETECTED Final   Influenza B NOT DETECTED NOT DETECTED Final   Parainfluenza Virus 1 NOT DETECTED NOT DETECTED Final   Parainfluenza Virus 2 NOT DETECTED NOT DETECTED Final   Parainfluenza Virus 3 NOT DETECTED NOT DETECTED Final   Parainfluenza Virus 4 NOT DETECTED NOT DETECTED Final   Respiratory Syncytial Virus DETECTED (A) NOT DETECTED Final    Comment: CRITICAL RESULT CALLED TO, READ BACK BY AND VERIFIED WITH: F TARPLEY RN 1858 10/21/16 A BROWNING    Bordetella pertussis NOT DETECTED NOT DETECTED Final   Chlamydophila pneumoniae NOT DETECTED NOT DETECTED Final   Mycoplasma pneumoniae NOT DETECTED NOT DETECTED Final       Radiology Studies: No results found.  Scheduled Meds: . arformoterol  15 mcg Nebulization BID  . atenolol  25 mg Oral QHS  . budesonide (PULMICORT) nebulizer solution  0.25 mg Nebulization BID  . enoxaparin (LOVENOX) injection  40 mg Subcutaneous Q24H  . guaiFENesin  1,200 mg Oral BID  . ipratropium-albuterol  3 mL Nebulization Q4H  . levofloxacin  750 mg Oral Daily  . levothyroxine  150 mcg Oral Daily  . methylPREDNISolone (SOLU-MEDROL) injection  60 mg Intravenous Q8H  . mometasone-formoterol  2 puff Inhalation BID  . pramipexole  0.75 mg Oral Q1200   And  . pramipexole  1.5 mg Oral QHS  . sodium chloride flush  3 mL Intravenous Q12H  . sodium chloride flush  3 mL Intravenous Q12H  . tiotropium  18 mcg Inhalation Daily   Continuous Infusions: . albuterol Stopped (10/21/16 0835)     LOS: 1 day    Time spent: 40 minutes   Noralee StainJennifer Lendy Dittrich, DO [facility] [email or web address] Password [email or web address] 10/23/2016, 11:31 AM

## 2016-10-23 NOTE — Progress Notes (Signed)
Pt removed from BiPAP and placed on N/C @ 6L.  Pt is resting with no signs of distress.  RN and family @ bedside.  Will continue to monitor.

## 2016-10-23 NOTE — Progress Notes (Addendum)
Rapid response, respiratory & MD notified that pt is c/o of not being able to catch her breath. Pt is currently on bipap. Respiratory came down to to see pt & adjusted her bipap to allow her to get more breaths but per pt that did not help her any. Pt's current O2 sat is 96 % but she still feels as if she can't breathe. Pt was given 2 mg of IV morphine & 0.5 mg of IV ativan. Per pt this occurs at home sometimes & it's always early in the mornings around this time. Per pt she can usually tell when it's going to happen but this time she couldn't tell. Will continue to monitor the pt. Sanda LingerMilam, Manolito Jurewicz R, RN   RN reassessed pt about 30 mins after incident & pt was resting comfortably. Sanda LingerMilam, Namari Breton R

## 2016-10-24 DIAGNOSIS — Z7189 Other specified counseling: Secondary | ICD-10-CM

## 2016-10-24 DIAGNOSIS — Z515 Encounter for palliative care: Secondary | ICD-10-CM

## 2016-10-24 LAB — BASIC METABOLIC PANEL
Anion gap: 8 (ref 5–15)
Anion gap: 8 (ref 5–15)
BUN: 25 mg/dL — AB (ref 6–20)
BUN: 25 mg/dL — ABNORMAL HIGH (ref 6–20)
CALCIUM: 9.2 mg/dL (ref 8.9–10.3)
CALCIUM: 9.3 mg/dL (ref 8.9–10.3)
CHLORIDE: 79 mmol/L — AB (ref 101–111)
CO2: 40 mmol/L — AB (ref 22–32)
CO2: 39 mmol/L — ABNORMAL HIGH (ref 22–32)
Chloride: 81 mmol/L — ABNORMAL LOW (ref 101–111)
Creatinine, Ser: 0.65 mg/dL (ref 0.44–1.00)
Creatinine, Ser: 0.61 mg/dL (ref 0.44–1.00)
GFR calc Af Amer: >60 mL/min (ref >60)
GFR calc Af Amer: >60 mL/min (ref >60)
GFR calc non Af Amer: >60 mL/min (ref >60)
GLUCOSE: 131 mg/dL — AB (ref 65–99)
Glucose, Bld: 125 mg/dL — ABNORMAL HIGH (ref 65–99)
Potassium: 4.3 mmol/L (ref 3.5–5.1)
Potassium: 4.3 mmol/L (ref 3.5–5.1)
SODIUM: 127 mmol/L — AB (ref 135–145)
Sodium: 128 mmol/L — ABNORMAL LOW (ref 135–145)

## 2016-10-24 LAB — CBC
HCT: 41.2 % (ref 36.0–46.0)
Hemoglobin: 13.6 g/dL (ref 12.0–15.0)
MCH: 30.6 pg (ref 26.0–34.0)
MCHC: 33 g/dL (ref 30.0–36.0)
MCV: 92.8 fL (ref 78.0–100.0)
PLATELETS: 241 10*3/uL (ref 150–400)
RBC: 4.44 MIL/uL (ref 3.87–5.11)
RDW: 12.7 % (ref 11.5–15.5)
WBC: 3.8 10*3/uL — AB (ref 4.0–10.5)

## 2016-10-24 MED ORDER — OXYMETAZOLINE HCL 0.05 % NA SOLN
1.0000 | Freq: Two times a day (BID) | NASAL | Status: DC | PRN
  Administered 2016-10-24 – 2016-10-26 (×3): 1 via NASAL
  Filled 2016-10-24: qty 15

## 2016-10-24 MED ORDER — MORPHINE SULFATE (PF) 2 MG/ML IV SOLN: 1.0000 mg | INTRAVENOUS | Status: DC | PRN

## 2016-10-24 MED ORDER — SALINE SPRAY 0.65 % NA SOLN
1.0000 | NASAL | Status: DC | PRN
  Filled 2016-10-24 (×2): qty 44

## 2016-10-24 MED ORDER — SODIUM CHLORIDE 1 G PO TABS
1.0000 g | ORAL_TABLET | Freq: Three times a day (TID) | ORAL | Status: DC
  Administered 2016-10-24 – 2016-10-26 (×8): 1 g via ORAL
  Filled 2016-10-24 (×9): qty 1

## 2016-10-24 MED ORDER — POLYETHYLENE GLYCOL 3350 17 G PO PACK
17.0000 g | PACK | Freq: Every day | ORAL | Status: DC
  Administered 2016-10-24 – 2016-10-26 (×2): 17 g via ORAL
  Filled 2016-10-24 (×3): qty 1

## 2016-10-24 MED ORDER — MORPHINE SULFATE (CONCENTRATE) 10 MG/0.5ML PO SOLN
5.0000 mg | ORAL | Status: DC | PRN
  Administered 2016-10-24 – 2016-10-25 (×3): 5 mg via ORAL
  Administered 2016-10-25: 10 mg via ORAL
  Administered 2016-10-25 (×2): 5 mg via ORAL
  Filled 2016-10-24 (×6): qty 0.5

## 2016-10-24 NOTE — Progress Notes (Addendum)
[facility] Progress Note  Patient: Teresa KnudsenMarlene Teresa Weiss Weiss VWU:[id number]   PCP: Teresa Freesory Nafziger, NP DOB: 02/25/1933   DOA: 10/21/2016   DOS: 10/24/2016   Date of Service: the patient was seen and examined on 10/24/2016  Brief hospital course: Teresa Weiss Teresa Weiss Teresa Weiss is a 83 yo female with PMHx of chronic respiratory failure, oxygen dependent COPD, OSA on CPAP at night, hypertension, hyperlipidemia, presented with 3 four-day history of worsening shortness of breath, coughing, wheezing. Upon presentation to the hospital, patient was placed on BiPAP for acute on chronic respiratory failure.  Assessment and Plan: Acute on chronic hypoxemic and hypercapnic respiratory failure secondary to COPD exacerbation and RSV  - Baseline on 4 L O2 at rest and 6 L on exertion - Off Bipap this morning, but suspect may require bipap QHS.  - Continue IV Solu-Medrol, levofloxacin, nebs   Chest tightness: likely from COPD exacerbation  - Echo: EF 60-65%, poor image quality no apical views. [place] f/u TEE or MRI Aorta is moderately dilated and should have f/u imaging as outpatient.  - No chest tightness or pain on exam today. Monitor.   Hyponatremia  - Patient was placed on gentle hydration at the time of admission, sodium trending down, hold IV fluids. Urine studies consistent with SIADH. Urine osmol 818, urine Na 67.  - Hold chlorthalidone, Celexa  - Fluid restriction - Trend BMP  - add sodium tablets.  HTN:  - Continue Atenolol - Hold the chlorthalidone due to hyponatremia   Hypothyroidism: - Continue synthroid  Depression/RLS: - Continue Mirapex  Bowel regimen: last BM 10/19/2016 miralax added Diet: cardiac diet DVT Prophylaxis: subcutaneous Heparin  Advance goals of care discussion: DNR DNI  Family Communication: family was present at bedside, at the time of interview. The pt provided permission to discuss medical plan with the family. Opportunity was given to ask question  and all questions were answered satisfactorily.   Disposition:  Discharge to home health. Expected discharge date: 10/27/2016  Consultants: palliative care Procedures: none  Antibiotics: Anti-infectives    Start     Dose/Rate Route Frequency Ordered Stop   10/23/16 1100  levofloxacin (LEVAQUIN) tablet 750 mg     750 mg Oral Daily 10/23/16 0708     10/21/16 1100  levofloxacin (LEVAQUIN) IVPB 750 mg  Status:  Discontinued     750 mg 100 mL/hr over 90 Minutes Intravenous Every 24 hours 10/21/16 1037 10/23/16 0708     Subjective: feeling better still has shortnes of breath   Objective: Physical Exam: Vitals:   10/24/16 1148 10/24/16 1153 10/24/16 1206 10/24/16 1535  BP:   138/86   Pulse:   77   Resp:   (!) 22   Temp:      TempSrc:      SpO2: 95% 93% 94% 93%  Weight:      Height:        Intake/Output Summary (Last 24 hours) at 10/24/16 1629 Last data filed at 10/24/16 1107  Gross per 24 hour  Intake              645 ml  Output             1600 ml  Net             -955 ml   Filed Weights   10/21/16 0452 10/22/16 0340 10/24/16 0548  Weight: 91.6 kg (202 lb) 92.1 kg (203 lb) 91.6 kg (202 lb)    General: Alert, Awake and Oriented to Time,  Place and Person. Appear in moderate distress, affect appropriate Eyes: PERRL, Conjunctiva normal ENT: Oral Mucosa clear moist. Neck: difficult to assess JVD, no Abnormal Mass Or lumps Cardiovascular: S1 and S2 Present, no Murmur, Respiratory: Bilateral Air entry equal and Decreased, no use of accessory muscle, basal Crackles, Occasional wheezes Abdomen: Bowel Sound present, Soft and no tenderness Skin: no redness, no Rash, no induration Extremities: no Pedal edema, no calf tenderness Neurologic: Grossly no focal neuro deficit. Bilaterally Equal motor strength  Data Reviewed: CBC:  Recent Labs Lab 10/21/16 0455 10/22/16 0555 10/23/16 0620 10/24/16 0438  WBC 5.5 4.8 4.3 3.8*  HGB 13.0 13.2 12.3 13.6  HCT 38.4 39.5 38.1 41.2   MCV 90.4 91.0 93.6 92.8  PLT 273 270 241 241   Basic Metabolic Panel:  Recent Labs Lab 10/21/16 0455 10/22/16 0555 10/23/16 0620 10/24/16 0438 10/24/16 1509  NA 129* 126* 125* 127* 128*  K 3.7 4.2 4.4 4.3 4.3  CL 84* 80* 80* 79* 81*  CO2 35* 38* 38* 40* 39*  GLUCOSE 93 132* 125* 125* 131*  BUN 15 21* 25* 25* 25*  CREATININE 0.65 0.68 0.68 0.65 0.61  CALCIUM 9.2 9.2 8.9 9.2 9.3    Liver Function Tests: No results for input(s): AST, ALT, ALKPHOS, BILITOT, PROT, ALBUMIN in the last 168 hours. No results for input(s): LIPASE, AMYLASE in the last 168 hours. No results for input(s): AMMONIA in the last 168 hours. Coagulation Profile: No results for input(s): INR, PROTIME in the last 168 hours. Cardiac Enzymes:  Recent Labs Lab 10/21/16 0455 10/21/16 1037 10/21/16 1723  TROPONINI <0.03 <0.03 <0.03   BNP (last 3 results) No results for input(s): PROBNP in the last 8760 hours.  CBG: No results for input(s): GLUCAP in the last 168 hours.  Studies: No results found.   Scheduled Meds: . arformoterol  15 mcg Nebulization BID  . atenolol  25 mg Oral QHS  . budesonide (PULMICORT) nebulizer solution  0.25 mg Nebulization BID  . enoxaparin (LOVENOX) injection  40 mg Subcutaneous Q24H  . guaiFENesin  1,200 mg Oral BID  . ipratropium-albuterol  3 mL Nebulization Q4H  . levofloxacin  750 mg Oral Daily  . levothyroxine  150 mcg Oral Daily  . methylPREDNISolone (SOLU-MEDROL) injection  60 mg Intravenous Q8H  . mometasone-formoterol  2 puff Inhalation BID  . pramipexole  0.75 mg Oral Q1200   And  . pramipexole  1.5 mg Oral QHS  . sodium chloride flush  3 mL Intravenous Q12H  . sodium chloride  1 g Oral TID WC  . tiotropium  18 mcg Inhalation Daily   Continuous Infusions: . albuterol Stopped (10/21/16 0835)   PRN Meds: sodium chloride, acetaminophen **OR** acetaminophen, gi cocktail, LORazepam, morphine injection, morphine CONCENTRATE, ondansetron **OR** ondansetron  (ZOFRAN) IV, oxymetazoline, sodium chloride, sodium chloride flush  Time spent: 30 minutes  Author: Lynden Oxford, MD [facility] Pager: [phone] 10/24/2016 4:29 PM  If 7PM-7AM, please contact night-coverage at [email or web address], password [facility]

## 2016-10-24 NOTE — Consult Note (Signed)
Consultation Note Date: 10/24/2016   Patient Name: Teresa Weiss  DOB: [birth year]  MRN: [id number]  Age / Sex: 83 y.o., female  PCP: Dorothyann Peng, NP Referring Physician: Lavina Hamman, MD  Reason for Consultation: Establishing goals of care  HPI/Patient Profile: 83 y.o. female  with past medical history of COPD on home O2, OSA on CPAP HS, HLD, HTN, PAD, heart murmur, diverticulitis, urinary incontinence and depression admitted on 10/21/2016 with shortness of breath and wheezing. Using inhalers and was started on doxycycline and prednisone two days before admit with minimal relief. In ED, patient placed on BiPAP. Acute on chronic hypoxemic and hypercapnic respiratory failure secondary to COPD exacerbation and RSV. Baseline oxygen 4L at rest and 6L with exertion. Receiving IV solu-medrol, levofloxacin, and nebs. Also with hyponatremia. Per attending, urine studies consistent with SIADH. Son requested palliative medicine consultation for goals of care.   Clinical Assessment and Goals of Care: I have reviewed medical records and met with patient and son, Delfino Lovett at bedside. Introduced Palliative Medicine as specialized medical care for people living with serious illness. It focuses on providing relief from the symptoms and stress of a serious illness. The goal is to improve quality of life for both the patient and the family.  Discussed a brief life review of the patient. She is originally from [place]. She has two children. She moved to [place] shortly after her son, Delfino Lovett moved here. Ms. Knab lives at senior living apartments. She has lived there for about 5 months and enjoys the environment and friends she has met.   I attempted to elicit values and goals of care important to the patient. Living independently and her family are important to her. She tells me she has had "COPD for many years" and  "I am getting tired." Wears 4L O2 baseline and 6L with exertion. She wears her CPAP every night.    Discussed natural trajectory of COPD and disease progression. Discussed the difference between aggressive medical interventions and a comfort care pathway. Asked patient what she wants for herself knowing her COPD is progressing? She tells me she hasn't had time to think about this, but knows she would not want to be resuscitated or on life support. If this would continue to be a cycle of re-hospitalization, I do not think the patient would want this for herself. She would want to be comfortable and at home.   Hospice and Palliative Care services outpatient were explained and offered. Patient and son open to having palliative follow on discharge and if she should continue to decline, they would be open to hospice services. They want to see how the next few days play out and hopeful she will show improvement.   Throughout our conversation, patient sitting on side of bed with dyspnea at rest. CPAP on. She tells me her biggest fear is "suffocating." Educated on prn opiod that may help the feeling of suffocation and SOB/air hunger. Patient and son agree with low dose Roxanol as needed.   Questions and  concerns were addressed. I left a [facility] copy and my contact information with Richard.    SUMMARY OF RECOMMENDATIONS    DNR/DNI  Patient and family hopeful for improvement but also open to hospice services if she should decline. Agreeable for palliative services to follow outpatient if she is not ready for hospice services.   PMT will continue to shadow chart and further discuss disposition options with patient/family.   Code Status/Advance Care Planning:  DNR   Symptom Management:   Roxanol 72m PO q2h prn pain/dyspnea/air hunger  Palliative Prophylaxis:   Aspiration, Delirium Protocol, Frequent Pain Assessment and Oral Care  Psycho-social/Spiritual:   Desire for further Chaplaincy  support:yes  Additional Recommendations: Caregiving  Support/Resources and Education on Hospice  Prognosis:   Unable to determine  Discharge Planning: To Be Determined home palliative services vs. Home hospice services     Primary Diagnoses: Present on Admission: . COPD exacerbation ([place]   I have reviewed the medical record, interviewed the patient and family, and examined the patient. The following aspects are pertinent.  Past Medical History:  Diagnosis Date  . Chicken pox   . COPD (chronic obstructive pulmonary disease) ([place]   . Depression   . Diverticulitis   . Essential hypertension   . Heart murmur   . Hyperlipidemia   . Hypothyroidism   . Oxygen dependent   . PAD (peripheral artery disease) ([facility]   . Psoriatic arthritis ([place]   . Sleep apnea   . Urinary incontinence    Social History   Social History  . Marital status: Legally Separated    Spouse name: N/A  . Number of children: N/A  . Years of education: N/A   Social History Main Topics  . Smoking status: Former SResearch scientist (life sciences) . Smokeless tobacco: Never Used  . Alcohol use Yes     Comment:  socially  . Drug use: No  . Sexual activity: Not Asked   Other Topics Concern  . None   Social History Narrative   [place]    Worked as Radiology tech    Has a son and daughter. [place] [employer]    Family History  Problem Relation Age of Onset  . Hyperlipidemia Father   . Hypertension Father   . Breast cancer Maternal Grandmother   . Heart attack Maternal Grandfather   . Heart disease Maternal Uncle    Scheduled Meds: . arformoterol  15 mcg Nebulization BID  . atenolol  25 mg Oral QHS  . budesonide (PULMICORT) nebulizer solution  0.25 mg Nebulization BID  . enoxaparin (LOVENOX) injection  40 mg Subcutaneous Q24H  . guaiFENesin  1,200 mg Oral BID  . ipratropium-albuterol  3 mL Nebulization Q4H  . levofloxacin  750 mg Oral Daily  . levothyroxine  150 mcg Oral Daily  .  methylPREDNISolone (SOLU-MEDROL) injection  60 mg Intravenous Q8H  . mometasone-formoterol  2 puff Inhalation BID  . polyethylene glycol  17 g Oral Daily  . pramipexole  0.75 mg Oral Q1200   And  . pramipexole  1.5 mg Oral QHS  . sodium chloride flush  3 mL Intravenous Q12H  . sodium chloride  1 g Oral TID WC  . tiotropium  18 mcg Inhalation Daily   Continuous Infusions: . albuterol Stopped (10/21/16 0835)   PRN Meds:.sodium chloride, acetaminophen **OR** acetaminophen, gi cocktail, LORazepam, morphine injection, morphine CONCENTRATE, ondansetron **OR** ondansetron (ZOFRAN) IV, oxymetazoline, sodium chloride, sodium chloride flush Medications Prior to Admission:  Prior  to Admission medications   Medication Sig Start Date End Date Taking? Authorizing Provider  albuterol (PROVENTIL HFA;VENTOLIN HFA) 108 (90 Base) MCG/ACT inhaler Inhale 1-2 puffs into the lungs every 6 (six) hours as needed for wheezing or shortness of breath.   Yes Historical Provider, MD  atenolol (TENORMIN) 25 MG tablet Take 1 tablet (25 mg total) by mouth at bedtime. Patient taking differently: Take 12.5 mg by mouth 2 (two) times daily.  10/07/16  Yes Dorothyann Peng, NP  budesonide-formoterol (SYMBICORT) 160-4.5 MCG/ACT inhaler Inhale 2 puffs into the lungs 3 (three) times daily.   Yes Historical Provider, MD  chlorthalidone (HYGROTON) 25 MG tablet Take 25 mg by mouth daily.   Yes Historical Provider, MD  cholecalciferol (VITAMIN D) 1000 units tablet Take 1,000 Units by mouth daily.   Yes Historical Provider, MD  citalopram (CELEXA) 20 MG tablet Take 1 tablet by mouth daily. 05/19/15  Yes Historical Provider, MD  doxycycline (VIBRA-TABS) 100 MG tablet Take 100 mg by mouth 2 (two) times daily. 10/19/16  Yes Historical Provider, MD  guaiFENesin (MUCINEX) 600 MG 12 hr tablet Take 600 mg by mouth 2 (two) times daily as needed for cough.   Yes Historical Provider, MD  ibuprofen (ADVIL,MOTRIN) 200 MG tablet Take 200 mg by mouth every  6 (six) hours as needed for moderate pain.   Yes Historical Provider, MD  levothyroxine (SYNTHROID, LEVOTHROID) 150 MCG tablet Take 1 tablet by mouth daily.   Yes Historical Provider, MD  Multiple Vitamin (MULTIVITAMIN) tablet Take 1 tablet by mouth daily.   Yes Historical Provider, MD  pramipexole (MIRAPEX) 1.5 MG tablet Take 1.5 mg by mouth at bedtime. Take 1/2 tablet at noon and 1 tablet at bedtime   Yes Historical Provider, MD  predniSONE (DELTASONE) 10 MG tablet Take 10-40 mg by mouth See admin instructions. 15m for 3 days, 329mfor 3 days, 2068mor 3 days, 26m73mr 3 days 10/19/16  Yes Historical Provider, MD  tiotropium (SPIRIVA) 18 MCG inhalation capsule Place 18 mcg into inhaler and inhale daily.    Yes Historical Provider, MD   Allergies  Allergen Reactions  . Infliximab Shortness Of Breath    Respiratory complications.  . Vicodin [Hydrocodone-Acetaminophen] Nausea And Vomiting   Review of Systems  Constitutional: Positive for activity change, appetite change and fatigue.  Respiratory: Positive for cough, chest tightness, shortness of breath and wheezing.   Gastrointestinal: Positive for nausea.  Neurological: Positive for weakness.    Physical Exam  Constitutional: She is oriented to person, place, and time. She is cooperative. She appears ill.  HENT:  Head: Normocephalic and atraumatic.  Cardiovascular: Regular rhythm.   Pulmonary/Chest: Accessory muscle usage present. Tachypnea noted. She has decreased breath sounds.  Dyspnea at rest-ordered prn Roxanol and instructed nurse to administer for air hunger/dyspnea  Abdominal: Normal appearance.  Neurological: She is alert and oriented to person, place, and time.  Skin: Skin is warm and dry. There is pallor.  Psychiatric: Her speech is normal. Her mood appears anxious. Cognition and memory are normal.  Nursing note and vitals reviewed.  Vital Signs: BP (!) 168/86 (BP Location: Right Arm)   Pulse 81   Temp 98.5 F (36.9 C)  (Axillary)   Resp 20   Ht _0  (1.676 m)   Wt 91.6 kg (202 lb)   SpO2 97%   BMI 32.60 kg/m  Pain Assessment: 0-10   Pain Score: 6   SpO2: SpO2: 97 % O2 Device:SpO2: 97 % O2 Flow Rate: .O2  Flow Rate (L/min): 6 L/min  IO: Intake/output summary:   Intake/Output Summary (Last 24 hours) at 10/24/16 2144 Last data filed at 10/24/16 1847  Gross per 24 hour  Intake              423 ml  Output              900 ml  Net             -477 ml    LBM: Last BM Date: 10/19/16 Baseline Weight: Weight: 91.6 kg (202 lb) Most recent weight: Weight: 91.6 kg (202 lb)     Palliative Assessment/Data: PPS 40%   Flowsheet Rows   Flowsheet Row Most Recent Value  Intake Tab  Referral Department  Hospitalist  Unit at Time of Referral  Med/Surg Unit  Palliative Care Primary Diagnosis  Pulmonary  Palliative Care Type  New Palliative care  Reason for referral  Clarify Goals of Care  Date of Admission  10/21/16  Date first seen by Palliative Care  10/24/16  Clinical Assessment  Palliative Performance Scale Score  40%  Psychosocial & Spiritual Assessment  Palliative Care Outcomes  Patient/Family meeting held?  Yes  Who was at the meeting?  patient and son Delfino Lovett)  Palliative Care Outcomes  Improved non-pain symptom therapy, Clarified goals of care, Counseled regarding hospice, Provided psychosocial or spiritual support, ACP counseling assistance      Time In: 1415 Time Out: 1525 Time Total: 63mn Greater than 50%  of this time was spent counseling and coordinating care related to the above assessment and plan.  Signed by:  MIhor Dow FNP-C Palliative Medicine Team  Phone: [phone]: [phone]  Please contact Palliative Medicine Team phone at [phone] questions and concerns.  For individual provider: See AShea Evans

## 2016-10-25 DIAGNOSIS — Z7189 Other specified counseling: Secondary | ICD-10-CM

## 2016-10-25 DIAGNOSIS — Z515 Encounter for palliative care: Secondary | ICD-10-CM

## 2016-10-25 LAB — BASIC METABOLIC PANEL
Anion gap: 10 (ref 5–15)
BUN: 22 mg/dL — ABNORMAL HIGH (ref 6–20)
CHLORIDE: 79 mmol/L — AB (ref 101–111)
CO2: 41 mmol/L — ABNORMAL HIGH (ref 22–32)
Calcium: 9 mg/dL (ref 8.9–10.3)
Creatinine, Ser: 0.65 mg/dL (ref 0.44–1.00)
GFR calc non Af Amer: >60 mL/min (ref >60)
Glucose, Bld: 130 mg/dL — ABNORMAL HIGH (ref 65–99)
POTASSIUM: 3.8 mmol/L (ref 3.5–5.1)
SODIUM: 130 mmol/L — AB (ref 135–145)

## 2016-10-25 LAB — CBC WITH DIFFERENTIAL/PLATELET
BASOS ABS: 0 10*3/uL (ref 0.0–0.1)
BASOS PCT: 0 %
EOS ABS: 0 10*3/uL (ref 0.0–0.7)
Eosinophils Relative: 0 %
HCT: 38.1 % (ref 36.0–46.0)
HEMOGLOBIN: 12.5 g/dL (ref 12.0–15.0)
Lymphocytes Relative: 8 %
Lymphs Abs: 0.3 10*3/uL — ABNORMAL LOW (ref 0.7–4.0)
MCH: 30.2 pg (ref 26.0–34.0)
MCHC: 32.8 g/dL (ref 30.0–36.0)
MCV: 92 fL (ref 78.0–100.0)
Monocytes Absolute: 0.3 10*3/uL (ref 0.1–1.0)
Monocytes Relative: 9 %
NEUTROS PCT: 83 %
Neutro Abs: 3.1 10*3/uL (ref 1.7–7.7)
Platelets: 210 10*3/uL (ref 150–400)
RBC: 4.14 MIL/uL (ref 3.87–5.11)
RDW: 12.6 % (ref 11.5–15.5)
WBC: 3.7 10*3/uL — AB (ref 4.0–10.5)

## 2016-10-25 LAB — MAGNESIUM: MAGNESIUM: 1.8 mg/dL (ref 1.7–2.4)

## 2016-10-25 MED ORDER — TIOTROPIUM BROMIDE MONOHYDRATE 18 MCG IN CAPS
18.0000 ug | ORAL_CAPSULE | Freq: Every day | RESPIRATORY_TRACT | Status: DC
  Administered 2016-10-26 – 2016-10-29 (×4): 18 ug via RESPIRATORY_TRACT

## 2016-10-25 MED ORDER — ATENOLOL 25 MG PO TABS
25.0000 mg | ORAL_TABLET | Freq: Every day | ORAL | Status: DC
  Administered 2016-10-25 – 2016-10-29 (×5): 25 mg via ORAL
  Filled 2016-10-25 (×5): qty 1

## 2016-10-25 MED ORDER — LEVOTHYROXINE SODIUM 75 MCG PO TABS
150.0000 ug | ORAL_TABLET | Freq: Every day | ORAL | Status: DC
  Administered 2016-10-26 – 2016-10-29 (×4): 150 ug via ORAL
  Filled 2016-10-25 (×4): qty 2

## 2016-10-25 MED ORDER — METHYLPREDNISOLONE SODIUM SUCC 40 MG IJ SOLR
40.0000 mg | Freq: Two times a day (BID) | INTRAMUSCULAR | Status: DC
  Administered 2016-10-25 – 2016-10-27 (×4): 40 mg via INTRAVENOUS
  Filled 2016-10-25 (×5): qty 1

## 2016-10-25 MED ORDER — MIRTAZAPINE 7.5 MG PO TABS
15.0000 mg | ORAL_TABLET | Freq: Every day | ORAL | Status: DC
  Administered 2016-10-25 – 2016-10-28 (×4): 15 mg via ORAL
  Filled 2016-10-25 (×4): qty 2

## 2016-10-25 MED ORDER — MORPHINE SULFATE (CONCENTRATE) 10 MG/0.5ML PO SOLN
10.0000 mg | ORAL | Status: DC | PRN
  Administered 2016-10-25 – 2016-10-27 (×10): 10 mg via SUBLINGUAL
  Administered 2016-10-27 (×4): 20 mg via SUBLINGUAL
  Administered 2016-10-27: 10 mg via SUBLINGUAL
  Administered 2016-10-28 (×2): 20 mg via SUBLINGUAL
  Administered 2016-10-28 (×2): 10 mg via SUBLINGUAL
  Filled 2016-10-25 (×2): qty 1
  Filled 2016-10-25 (×10): qty 0.5
  Filled 2016-10-25: qty 1
  Filled 2016-10-25: qty 0.5
  Filled 2016-10-25 (×2): qty 1
  Filled 2016-10-25: qty 0.5
  Filled 2016-10-25: qty 1
  Filled 2016-10-25: qty 0.5

## 2016-10-25 MED ORDER — METHYLPREDNISOLONE SODIUM SUCC 40 MG IJ SOLR: 40.0000 mg | Freq: Two times a day (BID) | INTRAMUSCULAR | Status: DC

## 2016-10-25 MED ORDER — TIOTROPIUM BROMIDE MONOHYDRATE 18 MCG IN CAPS
18.0000 ug | ORAL_CAPSULE | Freq: Every day | RESPIRATORY_TRACT | Status: DC
  Filled 2016-10-25: qty 5

## 2016-10-25 NOTE — Progress Notes (Signed)
[facility] Progress Note  Patient: Teresa Weiss RUE:[id number]   PCP: Shirline Frees, NP DOB: 1932-12-20   DOA: 10/21/2016   DOS: 10/25/2016   Date of Service: the patient was seen and examined on 10/25/2016  Brief hospital course: Teresa Weiss is a 83 yo female with PMHx of chronic respiratory failure, oxygen dependent COPD, OSA on CPAP at night, hypertension, hyperlipidemia, presented with 3 four-day history of worsening shortness of breath, coughing, wheezing. Upon presentation to the hospital, patient was placed on BiPAP for acute on chronic respiratory failure. Currently the patient is tolerating nasal cannula at 6 L and C Pap and feels her shortness of breath is getting better. Palliative care consulted for further assistance in management.  Assessment and Plan: Acute on chronic hypoxemic and hypercapnic respiratory failure secondary to COPD exacerbation and RSV  - Baseline on 4 L O2 at rest and 6 L on exertion - Off Bipap, continue CPAP daily at bedtime and when necessary - Continue IV Solu-Medrol change from every 8 hours 60 mg 2 every 12 hours 40 mg,nebs   Continue levofloxacin, last day 10/27/2016 total of 7 days Morphine makes her shortness of breath better, no other medication or treatment.  Chest tightness: likely from COPD exacerbation  - Echo: EF 60-65%, poor image quality no apical views. Cosider f/u TEE or MRI Aorta is moderately dilated and should have f/u imaging as outpatient.  - No chest tightness or pain on exam today. Monitor.   Hyponatremia getting better - Patient was placed on gentle hydration at the time of admission, sodium trending down, hold IV fluids. Urine studies consistent with SIADH. Urine osmol 818, urine Na 67.  - Hold chlorthalidone, Celexa  - Fluid restriction - Trend BMP  - add sodium tablets.  HTN:  - Continue Atenolol - Hold the chlorthalidone due to hyponatremia   Hypothyroidism: - Continue  synthroid  Depression/RLS: - Continue Mirapex  Bowel regimen: last BM 10/19/2016 miralax added Diet: cardiac diet DVT Prophylaxis: subcutaneous Heparin  Advance goals of care discussion: DNR DNI, palliative care discussed with patient and the family. At present the patient will be a good candidate for home with hospice. Patient shows some recovery but my concern is regarding her mobility at home as well as symptom control. We will monitor for today regarding patient's tolerance of activity and use of medications. Depending on that'll decide regarding possible discharge plan.  Family Communication: family was present at bedside, at the time of interview. The pt provided permission to discuss medical plan with the family. Opportunity was given to ask question and all questions were answered satisfactorily.   Disposition:  Discharge to home with hospice versus SNF with palliative care support. Expected discharge date: 10/27/2016  Consultants: palliative care Procedures: none  Antibiotics: Anti-infectives    Start     Dose/Rate Route Frequency Ordered Stop   10/23/16 1100  levofloxacin (LEVAQUIN) tablet 750 mg     750 mg Oral Daily 10/23/16 0708     10/21/16 1100  levofloxacin (LEVAQUIN) IVPB 750 mg  Status:  Discontinued     750 mg 100 mL/hr over 90 Minutes Intravenous Every 24 hours 10/21/16 1037 10/23/16 0708     Subjective: feeling better still has shortnes of breath , Complains about constipation, headache resolved  Objective: Physical Exam: Vitals:   10/25/16 0500 10/25/16 0813 10/25/16 0814 10/25/16 1206  BP: (!) 168/89     Pulse: 67     Resp:  Temp: 97.3 F (36.3 C)     TempSrc: Axillary     SpO2: 92% 92% 96% 97%  Weight: 91.7 kg (202 lb 1.6 oz)     Height:        Intake/Output Summary (Last 24 hours) at 10/25/16 1322 Last data filed at 10/25/16 0900  Gross per 24 hour  Intake              480 ml  Output             1500 ml  Net            -1020 ml    Filed Weights   10/22/16 0340 10/24/16 0548 10/25/16 0500  Weight: 92.1 kg (203 lb) 91.6 kg (202 lb) 91.7 kg (202 lb 1.6 oz)    General: Alert, Awake and Oriented to Time, Place and Person. Appear in moderate distress, affect appropriate Eyes: PERRL, Conjunctiva normal ENT: Oral Mucosa clear moist. Neck: difficult to assess JVD, no Abnormal Mass Or lumps Cardiovascular: S1 and S2 Present, no Murmur, Respiratory: Bilateral Air entry equal and Decreased, no use of accessory muscle, basal Crackles, Occasional wheezes Abdomen: Bowel Sound present, Soft and no tenderness Skin: no redness, no Rash, no induration Extremities: no Pedal edema, no calf tenderness Neurologic: Grossly no focal neuro deficit. Bilaterally Equal motor strength  Data Reviewed: CBC:  Recent Labs Lab 10/21/16 0455 10/22/16 0555 10/23/16 0620 10/24/16 0438 10/25/16 0331  WBC 5.5 4.8 4.3 3.8* 3.7*  NEUTROABS  --   --   --   --  3.1  HGB 13.0 13.2 12.3 13.6 12.5  HCT 38.4 39.5 38.1 41.2 38.1  MCV 90.4 91.0 93.6 92.8 92.0  PLT 273 270 241 241 210   Basic Metabolic Panel:  Recent Labs Lab 10/22/16 0555 10/23/16 0620 10/24/16 0438 10/24/16 1509 10/25/16 0331  NA 126* 125* 127* 128* 130*  K 4.2 4.4 4.3 4.3 3.8  CL 80* 80* 79* 81* 79*  CO2 38* 38* 40* 39* 41*  GLUCOSE 132* 125* 125* 131* 130*  BUN 21* 25* 25* 25* 22*  CREATININE 0.68 0.68 0.65 0.61 0.65  CALCIUM 9.2 8.9 9.2 9.3 9.0  MG  --   --   --   --  1.8    Liver Function Tests: No results for input(s): AST, ALT, ALKPHOS, BILITOT, PROT, ALBUMIN in the last 168 hours. No results for input(s): LIPASE, AMYLASE in the last 168 hours. No results for input(s): AMMONIA in the last 168 hours. Coagulation Profile: No results for input(s): INR, PROTIME in the last 168 hours. Cardiac Enzymes:  Recent Labs Lab 10/21/16 0455 10/21/16 1037 10/21/16 1723  TROPONINI <0.03 <0.03 <0.03   BNP (last 3 results) No results for input(s): PROBNP in the  last 8760 hours.  CBG: No results for input(s): GLUCAP in the last 168 hours.  Studies: No results found.   Scheduled Meds: . arformoterol  15 mcg Nebulization BID  . atenolol  25 mg Oral QHS  . budesonide (PULMICORT) nebulizer solution  0.25 mg Nebulization BID  . enoxaparin (LOVENOX) injection  40 mg Subcutaneous Q24H  . guaiFENesin  1,200 mg Oral BID  . ipratropium-albuterol  3 mL Nebulization Q4H  . levofloxacin  750 mg Oral Daily  . levothyroxine  150 mcg Oral Daily  . methylPREDNISolone (SOLU-MEDROL) injection  40 mg Intravenous Q12H  . mirtazapine  15 mg Oral QHS  . mometasone-formoterol  2 puff Inhalation BID  . polyethylene glycol  17 g Oral Daily  .  pramipexole  0.75 mg Oral Q1200   And  . pramipexole  1.5 mg Oral QHS  . sodium chloride flush  3 mL Intravenous Q12H  . sodium chloride  1 g Oral TID WC  . tiotropium  18 mcg Inhalation Daily   Continuous Infusions: . albuterol Stopped (10/21/16 0835)   PRN Meds: sodium chloride, acetaminophen **OR** acetaminophen, gi cocktail, LORazepam, morphine injection, morphine CONCENTRATE, ondansetron **OR** ondansetron (ZOFRAN) IV, oxymetazoline, sodium chloride, sodium chloride flush  Time spent: 30 minutes  Author: Lynden OxfordPranav Achsah Mcquade, MD [facility] Pager: [phone] 10/25/2016 1:22 PM  If 7PM-7AM, please contact night-coverage at [email or web address], password [email or web address]

## 2016-10-25 NOTE — Progress Notes (Addendum)
Palliative Follow-up  I met with Teresa Weiss and [name]. She is on CPAP, tolerating well but tells me she is getting tired. She is clearly very depressed. She is using PRN SL morphine about every 2 hours, she does say that she slept well. Her goals are clearly stated as comfort and to be free of suffering. She has +RSV and feels miserable. "Sometimes I just want to get it over with". Prior to admission she lived in [place] apartment=was able to do most things for herself-this has been a major set back.  Based on her goals and also working with her son we came up with the following plan:  1. Focus on comfort as primary goal, if she improves back to baseline we will discuss next steps on preventing rehospitalization, if she declines or requires medication that leads to more sedation we will discuss residential hospice options-I think either way she will need hospice upon discharge.  2. OOB to chair if possible and she is willing today   3. Increase PRN SL morphine-will see how much she has required and increase or start long acting in AM.  4. No Bipap, CPAP only PRN and at bedtime as tolerated-other wise nasal cannula O2  5. Scheduled Tylenol- this will help with body aches and augument Roxanol for pain and discomfort.  6. I discussed with Teresa Weiss present that his mom may need at least short term 24/7 in home care and assistance until we determine if she is go8ing to get back to her baseline.  7. We discussed Depression especially in COPD, she has been on celexa for years but doesn't think it helps her- has not been getting inpatient- will start Mirtazapine at bedtime.  8. May need to stay on prednisone or have prolonged taper.  Will continue to follow closely.  Lane Hacker, DO Palliative Medicine  Time: 35 min Greater than 50%  of this time was spent counseling and coordinating care related to the above assessment and plan.  [phone]

## 2016-10-25 NOTE — Progress Notes (Signed)
Patient has home mask, tubing and machine. Water chamber filled. Oxygen bled in at 6 LPM. Patient was placed on home mask. Patient tolerating well. RT made pt aware that if she had any issues or concerns to call. RT will continue to monitor. Pt resting comfortably at this time.

## 2016-10-26 LAB — BASIC METABOLIC PANEL
ANION GAP: 8 (ref 5–15)
BUN: 23 mg/dL — ABNORMAL HIGH (ref 6–20)
CO2: 41 mmol/L — ABNORMAL HIGH (ref 22–32)
Calcium: 9.1 mg/dL (ref 8.9–10.3)
Chloride: 84 mmol/L — ABNORMAL LOW (ref 101–111)
Creatinine, Ser: 0.74 mg/dL (ref 0.44–1.00)
GFR calc Af Amer: >60 mL/min (ref >60)
GLUCOSE: 220 mg/dL — AB (ref 65–99)
POTASSIUM: 4 mmol/L (ref 3.5–5.1)
Sodium: 133 mmol/L — ABNORMAL LOW (ref 135–145)

## 2016-10-26 MED ORDER — SENNOSIDES-DOCUSATE SODIUM 8.6-50 MG PO TABS
2.0000 | ORAL_TABLET | Freq: Two times a day (BID) | ORAL | Status: DC
  Administered 2016-10-27 – 2016-10-29 (×4): 2 via ORAL
  Filled 2016-10-26 (×6): qty 2

## 2016-10-26 MED ORDER — POLYETHYLENE GLYCOL 3350 17 G PO PACK
17.0000 g | PACK | Freq: Two times a day (BID) | ORAL | Status: DC
  Administered 2016-10-27 – 2016-10-29 (×4): 17 g via ORAL
  Filled 2016-10-26 (×6): qty 1

## 2016-10-26 MED ORDER — FUROSEMIDE 10 MG/ML IJ SOLN
20.0000 mg | Freq: Once | INTRAMUSCULAR | Status: AC
  Administered 2016-10-26: 20 mg via INTRAVENOUS
  Filled 2016-10-26: qty 2

## 2016-10-26 NOTE — Progress Notes (Signed)
Inpatient Diabetes Program Recommendations  AACE/ADA: New Consensus Statement on Inpatient Glycemic Control (2015)  Target Ranges:  Prepandial:   less than 140 mg/dL      Peak postprandial:   less than 180 mg/dL (1-2 hours)      Critically ill patients:  140 - 180 mg/dL   No results found for: GLUCAP, HGBA1C  Review of Glycemic Control Results for Teresa Weiss, Teresa Weiss (MRN [id number]) as of 10/26/2016 13:58  Ref. Range 10/26/2016 03:51  Glucose Latest Ref Range: 65 - 99 mg/dL 696220 (H)   Inpatient Diabetes Program Recommendations:  No known hx DM. Please consider: -A1c  -Novolog correction 0-9 units tid, while on steroids   Thank you, Darel HongJudy E. Skye Plamondon, RN, MSN, CDE Inpatient Glycemic Control Team Team Pager [phone] (8am-5pm) 10/26/2016 2:00 PM

## 2016-10-26 NOTE — Progress Notes (Addendum)
[facility] Progress Note  Patient: Teresa Weiss ZOX:[id number]   PCP: Shirline Frees, NP DOB: 19-Jan-1933   DOA: 10/21/2016   DOS: 10/26/2016   Date of Service: the patient was seen and examined on 10/26/2016  Brief hospital course: Nalla Evonne Boughton Howden is a 83 yo female with PMHx of chronic respiratory failure, oxygen dependent COPD, OSA on CPAP at night, hypertension, hyperlipidemia, presented with 3 four-day history of worsening shortness of breath, coughing, wheezing. Upon presentation to the hospital, patient was placed on BiPAP for acute on chronic respiratory failure. Currently the patient is tolerating nasal cannula at 6 L and C Pap and feels her shortness of breath is getting better. Palliative care consulted for further assistance in management.  Assessment and Plan: Acute on chronic hypoxemic and hypercapnic respiratory failure secondary to COPD exacerbation and RSV  - Baseline on 4 L O2 at rest and 6 L on exertion - Off Bipap, continue CPAP daily at bedtime and when necessary - Continue IV Solu-Medrol every 12 hours 40 mg, nebs   Continue levofloxacin, last day 10/27/2016 total of 7 days Morphine makes her shortness of breath better, no other medication or treatment. Per nursing the patient was able to ambulate in the hallway 10/26/2016. PT OT consulted. Give 1 dose of IV Lasix 20 mg 1 to see if the patient's shortness of breath improves with that.  Chest tightness: likely from COPD exacerbation  - Echo: EF 60-65%, poor image quality no apical views. Cosider f/u TEE or MRI Aorta is moderately dilated and should have f/u imaging as outpatient.  - No chest tightness or pain on exam today. Monitor.   Hyponatremia getting better - Patient was placed on gentle hydration at the time of admission, sodium trending down, hold IV fluids. Urine studies consistent with SIADH. Urine osmol 818, urine Na 67.  - Hold chlorthalidone, Celexa  - Fluid restriction -  Trend daily BMP  - Patient was given sodium tablets. Stop it on 10/26/2016  HTN:  - Continue Atenolol - Hold the chlorthalidone due to hyponatremia   Hypothyroidism: - Continue synthroid  Depression/RLS: - Continue Mirapex  Goals of care discussion. appreciate palliative care input. Currently on morphine when necessary for symptom control. Palliative care considering possible MS Contin use as well. Hospice once the patient is admitted to be discharged is recommended at present I feel that the patient will meet hospice criteria. With patient's deconditioning PT OT consulted, patient is refusing to go to SNF. Unsure whether the family will be able to arrange 24 7 care for the patient at home. IP rehab consulted by palliative care. We will monitor recommendation.  Bowel regimen: last BM 10/19/2016 miralax twice a day, Senokot twice a day Diet: cardiac diet DVT Prophylaxis: subcutaneous Heparin  Advance goals of care discussion: DNR DNI, palliative care discussed with patient and the family. At present the patient will be a good candidate for home with hospice. Patient shows some recovery but my concern is regarding her mobility at home as well as symptom control. We will monitor for today regarding patient's tolerance of activity and use of medications. Depending on that'll decide regarding possible discharge plan.  Family Communication: no family was present at bedside, at the time of interview.   Disposition:  Discharge to home with hospice versus SNF with palliative care support. Expected discharge date: 10/28/2016  Consultants: palliative care Procedures: none  Antibiotics: Anti-infectives    Start     Dose/Rate Route Frequency Ordered Stop  10/23/16 1100  levofloxacin (LEVAQUIN) tablet 750 mg     750 mg Oral Daily 10/23/16 0708     10/21/16 1100  levofloxacin (LEVAQUIN) IVPB 750 mg  Status:  Discontinued     750 mg 100 mL/hr over 90 Minutes Intravenous Every 24  hours 10/21/16 1037 10/23/16 0708     Subjective: feeling better still has shortnes of breath and constipation.  Objective: Physical Exam: Vitals:   10/26/16 0730 10/26/16 1144 10/26/16 1255 10/26/16 1532  BP:   116/70   Pulse:   87   Resp:   16   Temp:   98.3 F (36.8 C)   TempSrc:   Oral   SpO2: 98% 98% 99% 98%  Weight:      Height:        Intake/Output Summary (Last 24 hours) at 10/26/16 1601 Last data filed at 10/26/16 1257  Gross per 24 hour  Intake              600 ml  Output             1050 ml  Net             -450 ml   Filed Weights   10/24/16 0548 10/25/16 0500 10/26/16 0507  Weight: 91.6 kg (202 lb) 91.7 kg (202 lb 1.6 oz) 93.3 kg (205 lb 9.6 oz)    General: Alert, Awake and Oriented to Time, Place and Person. Appear in moderate distress, affect appropriate Eyes: PERRL, Conjunctiva normal ENT: Oral Mucosa clear moist. Neck: difficult to assess JVD, no Abnormal Mass Or lumps Cardiovascular: S1 and S2 Present, no Murmur, Respiratory: Bilateral Air entry equal and Decreased, no use of accessory muscle, basal Crackles, Occasional wheezes Abdomen: Bowel Sound present, Soft and no tenderness Skin: no redness, no Rash, no induration Extremities: no Pedal edema, no calf tenderness Neurologic: Grossly no focal neuro deficit. Bilaterally Equal motor strength  Data Reviewed: CBC:  Recent Labs Lab 10/21/16 0455 10/22/16 0555 10/23/16 0620 10/24/16 0438 10/25/16 0331  WBC 5.5 4.8 4.3 3.8* 3.7*  NEUTROABS  --   --   --   --  3.1  HGB 13.0 13.2 12.3 13.6 12.5  HCT 38.4 39.5 38.1 41.2 38.1  MCV 90.4 91.0 93.6 92.8 92.0  PLT 273 270 241 241 210   Basic Metabolic Panel:  Recent Labs Lab 10/23/16 0620 10/24/16 0438 10/24/16 1509 10/25/16 0331 10/26/16 0351  NA 125* 127* 128* 130* 133*  K 4.4 4.3 4.3 3.8 4.0  CL 80* 79* 81* 79* 84*  CO2 38* 40* 39* 41* 41*  GLUCOSE 125* 125* 131* 130* 220*  BUN 25* 25* 25* 22* 23*  CREATININE 0.68 0.65 0.61 0.65 0.74    CALCIUM 8.9 9.2 9.3 9.0 9.1  MG  --   --   --  1.8  --     Liver Function Tests: No results for input(s): AST, ALT, ALKPHOS, BILITOT, PROT, ALBUMIN in the last 168 hours. No results for input(s): LIPASE, AMYLASE in the last 168 hours. No results for input(s): AMMONIA in the last 168 hours. Coagulation Profile: No results for input(s): INR, PROTIME in the last 168 hours. Cardiac Enzymes:  Recent Labs Lab 10/21/16 0455 10/21/16 1037 10/21/16 1723  TROPONINI <0.03 <0.03 <0.03   BNP (last 3 results) No results for input(s): PROBNP in the last 8760 hours.  CBG: No results for input(s): GLUCAP in the last 168 hours.  Studies: No results found.   Scheduled Meds: . arformoterol  15 mcg Nebulization BID  . atenolol  25 mg Oral QHS  . budesonide (PULMICORT) nebulizer solution  0.25 mg Nebulization BID  . enoxaparin (LOVENOX) injection  40 mg Subcutaneous Q24H  . guaiFENesin  1,200 mg Oral BID  . ipratropium-albuterol  3 mL Nebulization Q4H  . levofloxacin  750 mg Oral Daily  . levothyroxine  150 mcg Oral QAC breakfast  . methylPREDNISolone (SOLU-MEDROL) injection  40 mg Intravenous Q12H  . mirtazapine  15 mg Oral QHS  . mometasone-formoterol  2 puff Inhalation BID  . polyethylene glycol  17 g Oral Daily  . pramipexole  0.75 mg Oral Q1200   And  . pramipexole  1.5 mg Oral QHS  . sodium chloride flush  3 mL Intravenous Q12H  . sodium chloride  1 g Oral TID WC  . tiotropium  18 mcg Inhalation Daily   Continuous Infusions: . albuterol Stopped (10/21/16 0835)   PRN Meds: sodium chloride, acetaminophen **OR** acetaminophen, gi cocktail, LORazepam, morphine injection, morphine CONCENTRATE, ondansetron **OR** ondansetron (ZOFRAN) IV, oxymetazoline, sodium chloride, sodium chloride flush  Time spent: 30 minutes  Author: Lynden Oxford, MD [facility] Pager: [phone] 10/26/2016 4:01 PM  If 7PM-7AM, please contact night-coverage at [email or web address], password [facility]

## 2016-10-26 NOTE — Progress Notes (Signed)
RN helped patient to chair with walker. Pt did not tolerate well. Very distressed, but agreed to stay up in the chair for lunch. RN administered morphine, and will continue to monitor

## 2016-10-26 NOTE — Progress Notes (Signed)
Physical medicine rehabilitation consult requested chart reviewed. Currently awaiting occupational therapy evaluation to be completed ordered 10/26/2016 as well as recommendations for physical therapy consultation/evaluation. Will follow up with physical medicine rehabilitation consult after recommendations made by therapies

## 2016-10-26 NOTE — Care Management Important Message (Signed)
Important Message  Patient Details  Name: Teresa Weiss MRN: [id number] Date of Birth: 02/08/1933   Medicare Important Message Given:  Yes    [name], [name] 10/26/2016, 12:26 PM

## 2016-10-26 NOTE — Progress Notes (Signed)
Patient stated she did not want to be woken up tonight for breathing treatments, states she is tired and would like to get good sleep tonight. RT made pt aware that if she came off CPAP of anytime during the night felt she wanted a breathing treatment to call and we could come back anytime. RN at bedside during patient statement. Home CPAP was placed on patient with 6 LPM bled in. Pt comfortable, no distress.

## 2016-10-26 NOTE — Progress Notes (Addendum)
Palliative Care Follow-up  Ms. Teresa Weiss looks much improved today. She is much less "down" and more positive in general that she is recovering. She was able to walk about 11000ft with PT [facility] She is on nasal cannula and has not required CPAP except at night which is her baseline. She reports significant improvement with the PRN roxanol for her dyspnea. She is able to ask for it.   I discussed the following recommendations with her and [name] (VP of Heart and Vascular) who is her HCPOA:  1. She appears top be getting over this acute illness (viral URI +COPD) but she is deconditioned-she was previously in independent living apartments and her goal is to return there. She would be a good candidate for CIR to work on getting more independent so she can return safely. She has been recieveing morphine PRN fairly regularly for DYSPNEA, would be good to determine in a more controlled setting what her needs will be when she is more active.  2. At the point which she returns home I have recommended Hospice care based on her goals and on her COPD - I believe she would meet eligibility requirements and this would be great support in the home. She does not want SNF level care at any point.   3. Considering low dose MScontin- will see if her needs follow a pattern or level out before dosing. Appreciate nursing staying on top of her symptom management.  Should be ready for discharge soon. Will await PT and OT evaluations.  Anderson MaltaElizabeth Golding, DO Palliative Medicine [phone]  Time: 35 minutes Greater than 50%  of this time was spent counseling and coordinating care related to the above assessment and plan.

## 2016-10-26 NOTE — Progress Notes (Signed)
Occupational Therapy Evaluation Patient Details Name: Valinda Fedie MRN: [id number] DOB: 03-Oct-1933 Today's Date: 10/26/2016    History of Present Illness 83 y.o. female with medical history significant of oxygen dependent COPD, depression, diverticulosis, hypertension, hyperlipidemia, psoriasis, urinary incontinence, presenting with 3-4 day history of worsening shortness of breath.Pt with COPD exacerbation and RSV.    Clinical Impression   PTA, pt lived at [facility] and was modified independent with ADL and mobility using a rollator. At baseline, pt completes her own ADL tasks, does simple snack preparation in her apt and walks a "good distance" to the dining hall or activity area. Pt currently improving her ability to ambulate further distances from this am with nsg (20 ft) to this pm (9ft). Dypsnea 3/4. O2 Sats 94 on 6L. Feel pt is an excellent CIR candidate to improve her ability to return to her apt at a modified independent level. Will follow acutely with focus on energy conservation strategies and use of DME and AE to increase her ability to complete ADL and mobility. Pt very appreciative.     Follow Up Recommendations  CIR    Equipment Recommendations  3 in 1 bedside commode    Recommendations for Other Services Rehab consult     Precautions / Restrictions Precautions Precautions: Fall Precaution Comments: watch O2 Sats      Mobility Bed Mobility Overal bed mobility: Needs Assistance Bed Mobility: Sit to Supine       Sit to supine: Min guard   General bed mobility comments: increased SOB with bed mobility  Transfers Overall transfer level: Needs assistance Equipment used: Rolling walker (2 wheeled) Transfers: Sit to/from Stand Sit to Stand: Min guard         General transfer comment: Pt states she "just feels unsteady"    Balance Overall balance assessment: Needs assistance   Sitting balance-Leahy Scale: Good       Standing  balance-Leahy Scale: Fair                              ADL Overall ADL's : Needs assistance/impaired Eating/Feeding: Independent   Grooming: Set up;Standing   Upper Body Bathing: Set up;Supervision/ safety;Sitting   Lower Body Bathing: Moderate assistance;Sit to/from stand   Upper Body Dressing : Supervision/safety;Set up;Sitting   Lower Body Dressing: Moderate assistance;Sit to/from stand   Toilet Transfer: Minimal assistance;Ambulation;RW;BSC   Toileting- Clothing Manipulation and Hygiene: Set up;Supervision/safety;Sit to/from stand       Functional mobility during ADLs: Minimal assistance;Rolling walker General ADL Comments: Able to ambulate @ 80 ft @ RW level     Vision     Perception     Praxis      Pertinent Vitals/Pain Pain Assessment: No/denies pain     Hand Dominance     Extremity/Trunk Assessment Upper Extremity Assessment Upper Extremity Assessment: Generalized weakness   Lower Extremity Assessment Lower Extremity Assessment: Defer to PT evaluation   Cervical / Trunk Assessment Cervical / Trunk Assessment: Normal   Communication Communication Communication: No difficulties   Cognition Arousal/Alertness: Awake/alert Behavior During Therapy: WFL for tasks assessed/performed Overall Cognitive Status: Within Functional Limits for tasks assessed                     General Comments       Exercises       Shoulder Instructions      Home Living Family/patient expects to be discharged to:: Unsure  Prior Functioning/Environment Level of Independence: Independent with assistive device(s);Needs assistance  Gait / Transfers Assistance Needed: uses rollator around ALF. Typically able to ambulate to elevators (lives on the 3rd floor), then to the activity room or dining hall.  ADL's / Homemaking Assistance Needed: modified independent. Only showers 2x/wk due to amount of  effort it requires LB ADL are difficult for her to cmoplete at baseline  Pt lives in handicap accessible apt. Built in shower seat. Walk in shower.          OT Problem List: Decreased strength;Decreased activity tolerance;Impaired balance (sitting and/or standing);Decreased safety awareness;Decreased knowledge of use of DME or AE;Cardiopulmonary status limiting activity;Obesity   OT Treatment/Interventions: Self-care/ADL training;Therapeutic exercise;Energy conservation;DME and/or AE instruction;Therapeutic activities;Patient/family education    OT Goals(Current goals can be found in the care plan section) Acute Rehab OT Goals Patient Stated Goal: to be more independent OT Goal Formulation: With patient Time For Goal Achievement: 11/09/16 Potential to Achieve Goals: Good ADL Goals Pt Will Perform Lower Body Bathing: with modified independence;sit to/from stand;with adaptive equipment Pt Will Perform Lower Body Dressing: with modified independence;with adaptive equipment;sit to/from stand Pt Will Transfer to Toilet: with modified independence;ambulating;bedside commode Pt Will Perform Toileting - Clothing Manipulation and hygiene: with modified independence;sit to/from stand;with adaptive equipment Additional ADL Goal #1: Pt will demonstrate 3 energy conservation technqiues during ADL task with min vc  OT Frequency: Min 2X/week   Barriers to D/C:            Co-evaluation              End of Session Equipment Utilized During Treatment: Gait belt;Rolling walker;Oxygen (6L) Nurse Communication: Mobility status  Activity Tolerance: Patient tolerated treatment well Patient left: in bed;with call bell/phone within reach   Time: 1520-1552 OT Time Calculation (min): 32 min Charges:  OT General Charges $OT Visit: 1 Procedure OT Evaluation $OT Eval Moderate Complexity: 1 Procedure OT Treatments $Self Care/Home Management : 8-22 mins G-Codes:    Markas Aldredge,HILLARY 10/26/2016, 3:55  PM   [facility] Taniqua Issa, OR/L  [phone] 10/26/2016

## 2016-10-26 NOTE — Progress Notes (Signed)
RN walked patient in hall. Upon return pt very SOB, requiring morphine immediately. Cecille Rubinhompson,Renda Pohlman V, RN

## 2016-10-26 NOTE — Progress Notes (Signed)
Rehab Admissions Coordinator Note:  Patient was screened by Trish MageLogue, Anayi Bricco M for appropriateness for an Inpatient Acute Rehab Consult.  At this time, an inpatient rehab consult has been ordered and is pending.   Trish MageLogue, Floreine Kingdon M 10/26/2016, 4:53 PM  I can be reached at [phone].

## 2016-10-27 DIAGNOSIS — F411 Generalized anxiety disorder: Secondary | ICD-10-CM

## 2016-10-27 DIAGNOSIS — I10 Essential (primary) hypertension: Secondary | ICD-10-CM

## 2016-10-27 DIAGNOSIS — J9622 Acute and chronic respiratory failure with hypercapnia: Secondary | ICD-10-CM

## 2016-10-27 DIAGNOSIS — F329 Major depressive disorder, single episode, unspecified: Secondary | ICD-10-CM

## 2016-10-27 DIAGNOSIS — R52 Pain, unspecified: Secondary | ICD-10-CM

## 2016-10-27 DIAGNOSIS — J9621 Acute and chronic respiratory failure with hypoxia: Secondary | ICD-10-CM

## 2016-10-27 DIAGNOSIS — J441 Chronic obstructive pulmonary disease with (acute) exacerbation: Principal | ICD-10-CM

## 2016-10-27 DIAGNOSIS — E785 Hyperlipidemia, unspecified: Secondary | ICD-10-CM

## 2016-10-27 LAB — BASIC METABOLIC PANEL
Anion gap: 9 (ref 5–15)
BUN: 23 mg/dL — AB (ref 6–20)
CHLORIDE: 84 mmol/L — AB (ref 101–111)
CO2: 43 mmol/L — ABNORMAL HIGH (ref 22–32)
Calcium: 9.2 mg/dL (ref 8.9–10.3)
Creatinine, Ser: 0.74 mg/dL (ref 0.44–1.00)
GFR calc Af Amer: >60 mL/min (ref >60)
GLUCOSE: 141 mg/dL — AB (ref 65–99)
POTASSIUM: 4.7 mmol/L (ref 3.5–5.1)
Sodium: 136 mmol/L (ref 135–145)

## 2016-10-27 MED ORDER — PREDNISONE 20 MG PO TABS
40.0000 mg | ORAL_TABLET | Freq: Two times a day (BID) | ORAL | Status: DC
  Administered 2016-10-27 – 2016-10-29 (×4): 40 mg via ORAL
  Filled 2016-10-27 (×4): qty 2

## 2016-10-27 NOTE — Progress Notes (Signed)
[facility] Progress Note  Patient: Teresa Weiss   PCP: Shirline Freesory Nafziger, NP DOB: 08/25/1933   DOA: 10/21/2016   DOS: 10/27/2016   Date of Service: the patient was seen and examined on 10/27/2016  Brief hospital course: [name] is a 83 yo female with PMHx of chronic respiratory failure, oxygen dependent COPD, OSA on CPAP at night, hypertension, hyperlipidemia, presented with 3 four-day history of worsening shortness of breath, coughing, wheezing. Upon presentation to the hospital, patient was placed on BiPAP for acute on chronic respiratory failure. Currently the patient is tolerating nasal cannula at 6 L and C Pap and feels her shortness of breath is getting better. Palliative care consulted for further assistance in management. Currently the plan is to go to rehabilitation for short-term and follow the course at home with possible care [facility] or Hospice.  Assessment and Plan: Acute on chronic hypoxemic and hypercapnic respiratory failure secondary to COPD exacerbation and RSV  - Baseline on 4 L O2 at rest and 6 L on exertion - Off Bipap, continue CPAP daily at bedtime and when necessary Change IV Solu-Medrol to oral prednisone, continue nebs   Completed levofloxacin, last day 10/27/2016 total of 7 days Morphine makes her shortness of breath better, no other medication or treatment. PTOT recommends CIR, appreciate input. S/P one dose of IV Lasix. No further diuresis required at present  Chest tightness: likely from COPD exacerbation  - Echo: EF 60-65%, poor image quality no apical views. Cosider f/u TEE or MRI Aorta is moderately dilated and should have f/u imaging as outpatient.  - No chest tightness or pain on exam today. Monitor.   Hyponatremia getting better - Patient was placed on gentle hydration at the time of admission, sodium trending down, hold IV fluids. Urine studies consistent with SIADH. Urine osmol 818, urine Na 67.   - Hold chlorthalidone, Celexa  - Fluid restriction - Trend daily BMP  - Patient was given sodium tablets. Stop it on 10/26/2016  HTN:  - Continue Atenolol - Hold the chlorthalidone due to hyponatremia   Hypothyroidism: - Continue synthroid  Depression/RLS: - Continue Mirapex  Goals of care discussion. appreciate palliative care input. Currently on morphine when necessary for symptom control. Palliative care considering possible MS Contin use as well. With patient's deconditioning PT OT consulted, patient is refusing to go to SNF. Unsure whether the family will be able to arrange 24 7 care for the patient at home. IP rehab consulted by palliative care.   Bowel regimen: last BM 10/19/2016 miralax twice a day, Senokot twice a day, patient refusing stool softeners Diet: cardiac diet DVT Prophylaxis: subcutaneous Heparin  Advance goals of care discussion: DNR DNI, palliative care discussed with patient and the family. At present the patient will be a good candidate for home with hospice. Patient shows some recovery but my concern is regarding her mobility at home as well as symptom control. Physical patient will benefit from IP rehabilitation in the short-term to improve her long-term outlook and quality of life.  Family Communication: no family was present at bedside, at the time of interview.   Disposition:  Discharge to home with hospice versus SNF with palliative care support. Expected discharge date: 10/28/2016  Consultants: palliative care Procedures: none  Antibiotics: Anti-infectives    Start     Dose/Rate Route Frequency Ordered Stop   10/23/16 1100  levofloxacin (LEVAQUIN) tablet 750 mg     750 mg Oral Daily 10/23/16 0708  10/21/16 1100  levofloxacin (LEVAQUIN) IVPB 750 mg  Status:  Discontinued     750 mg 100 mL/hr over 90 Minutes Intravenous Every 24 hours 10/21/16 1037 10/23/16 0708     Subjective: feeling better still has shortnes of breath and  constipation.Continues to refuse stool softeners.  Objective: Physical Exam: Vitals:   10/26/16 1946 10/26/16 2030 10/26/16 2100 10/27/16 0336  BP:  120/76  (!) 176/96  Pulse:  88 86 89  Resp:   18 20  Temp:  97.7 F (36.5 C)  97.4 F (36.3 C)  TempSrc:  Oral  Oral  SpO2: 99% 99% 98% 99%  Weight:      Height:        Intake/Output Summary (Last 24 hours) at 10/27/16 1441 Last data filed at 10/27/16 1300  Gross per 24 hour  Intake              833 ml  Output             1650 ml  Net             -817 ml   Filed Weights   10/24/16 0548 10/25/16 0500 10/26/16 0507  Weight: 91.6 kg (202 lb) 91.7 kg (202 lb 1.6 oz) 93.3 kg (205 lb 9.6 oz)    General: Alert, Awake and Oriented to Time, Place and Person. Appear in moderate distress, affect appropriate Eyes: PERRL, Conjunctiva normal ENT: Oral Mucosa clear moist. Neck: difficult to assess JVD, no Abnormal Mass Or lumps Cardiovascular: S1 and S2 Present, no Murmur, Respiratory: Bilateral Air entry equal and Decreased, no use of accessory muscle, basal Crackles, Occasional wheezes Abdomen: Bowel Sound present, Soft and no tenderness Skin: no redness, no Rash, no induration Extremities: no Pedal edema, no calf tenderness Neurologic: Grossly no focal neuro deficit. Bilaterally Equal motor strength  Data Reviewed: CBC:  Recent Labs Lab 10/21/16 0455 10/22/16 0555 10/23/16 0620 10/24/16 0438 10/25/16 0331  WBC 5.5 4.8 4.3 3.8* 3.7*  NEUTROABS  --   --   --   --  3.1  HGB 13.0 13.2 12.3 13.6 12.5  HCT 38.4 39.5 38.1 41.2 38.1  MCV 90.4 91.0 93.6 92.8 92.0  PLT 273 270 241 241 210   Basic Metabolic Panel:  Recent Labs Lab 10/24/16 0438 10/24/16 1509 10/25/16 0331 10/26/16 0351 10/27/16 0456  NA 127* 128* 130* 133* 136  K 4.3 4.3 3.8 4.0 4.7  CL 79* 81* 79* 84* 84*  CO2 40* 39* 41* 41* 43*  GLUCOSE 125* 131* 130* 220* 141*  BUN 25* 25* 22* 23* 23*  CREATININE 0.65 0.61 0.65 0.74 0.74  CALCIUM 9.2 9.3 9.0 9.1 9.2   MG  --   --  1.8  --   --     Liver Function Tests: No results for input(s): AST, ALT, ALKPHOS, BILITOT, PROT, ALBUMIN in the last 168 hours. No results for input(s): LIPASE, AMYLASE in the last 168 hours. No results for input(s): AMMONIA in the last 168 hours. Coagulation Profile: No results for input(s): INR, PROTIME in the last 168 hours. Cardiac Enzymes:  Recent Labs Lab 10/21/16 0455 10/21/16 1037 10/21/16 1723  TROPONINI <0.03 <0.03 <0.03   BNP (last 3 results) No results for input(s): PROBNP in the last 8760 hours.  CBG: No results for input(s): GLUCAP in the last 168 hours.  Studies: No results found.   Scheduled Meds: . arformoterol  15 mcg Nebulization BID  . atenolol  25 mg Oral QHS  . budesonide (  PULMICORT) nebulizer solution  0.25 mg Nebulization BID  . enoxaparin (LOVENOX) injection  40 mg Subcutaneous Q24H  . guaiFENesin  1,200 mg Oral BID  . ipratropium-albuterol  3 mL Nebulization Q4H  . levofloxacin  750 mg Oral Daily  . levothyroxine  150 mcg Oral QAC breakfast  . mirtazapine  15 mg Oral QHS  . mometasone-formoterol  2 puff Inhalation BID  . polyethylene glycol  17 g Oral BID  . pramipexole  0.75 mg Oral Q1200   And  . pramipexole  1.5 mg Oral QHS  . predniSONE  40 mg Oral BID WC  . senna-docusate  2 tablet Oral BID  . sodium chloride flush  3 mL Intravenous Q12H  . tiotropium  18 mcg Inhalation Daily   Continuous Infusions: . albuterol Stopped (10/21/16 0835)   PRN Meds: sodium chloride, acetaminophen **OR** acetaminophen, gi cocktail, LORazepam, morphine injection, morphine CONCENTRATE, ondansetron **OR** ondansetron (ZOFRAN) IV, oxymetazoline, sodium chloride, sodium chloride flush  Time spent: 30 minutes  Author: Lynden Oxford, MD [facility] Pager: [phone] 10/27/2016 2:41 PM  If 7PM-7AM, please contact night-coverage at [email or web address], password [email or web address]

## 2016-10-27 NOTE — Consult Note (Signed)
Physical Medicine and Rehabilitation Consult Reason for Consult: Debilitation related to COPD exacerbation Referring Physician: [facility]   HPI: Teresa Weiss is a 83 y.o. right handed female with history of hyperlipidemia, hypertension, COPD oxygen dependent at 4 L and sometimes 6 L depending on level of exertion. Per chart review patient is a resident of [facility] independent [facility] and lives alone. She used a rolling walker prior to admission. She does have family in the area that works and cannot provide 24-hour care. Presented 10/21/2016 with increasing shortness of breath 3-4 days with associated cough and wheezing. Patient had recently been started on doxycycline as well as prednisone. By report patient was in process of transferring her care from [facility] to [facility]. Chest x-ray showed no active pulmonary disease. Troponin negative. Placed on BiPAP for acute on chronic respiratory failure. Initially placed on broad-spectrum antibiotics simplify to levofloxin. Echocardiogram with ejection fraction of 65%. Systolic function was normal. Palliative care has been consulted to establish goals of care and await recommendations of possible hospice for home. Occupational therapy evaluation completed with recommendations of physical medicine rehabilitation consult.   Review of Systems  Constitutional: Negative for chills and fever.  HENT: Negative for hearing loss and tinnitus.   Eyes: Negative for blurred vision and double vision.  Respiratory: Positive for sputum production, shortness of breath and wheezing.   Cardiovascular: Negative for chest pain.  Gastrointestinal: Positive for constipation. Negative for nausea and vomiting.  Genitourinary: Negative for dysuria, flank pain and hematuria.  Musculoskeletal: Positive for joint pain and myalgias.  Skin: Negative for rash.  Neurological: Positive for weakness. Negative for seizures and loss of consciousness.    Psychiatric/Behavioral: Positive for depression.  All other systems reviewed and are negative.  Past Medical History:  Diagnosis Date  . Chicken pox   . COPD (chronic obstructive pulmonary disease) (HCC)   . Depression   . Diverticulitis   . Essential hypertension   . Heart murmur   . Hyperlipidemia   . Hypothyroidism   . Oxygen dependent   . PAD (peripheral artery disease) (HCC)   . Psoriatic arthritis (HCC)   . Sleep apnea   . Urinary incontinence    Past Surgical History:  Procedure Laterality Date  . ABDOMINAL HYSTERECTOMY  1977  . Appendectomy    . CATARACT EXTRACTION Bilateral   . COLON RESECTION    . INGUINAL HERNIA REPAIR    . Knee replacement  Left   . LUMBAR LAMINECTOMY     Family History  Problem Relation Age of Onset  . Hyperlipidemia Father   . Hypertension Father   . Breast cancer Maternal Grandmother   . Heart attack Maternal Grandfather   . Heart disease Maternal Uncle    Social History:  reports that she has quit smoking. She has never used smokeless tobacco. She reports that she drinks alcohol. She reports that she does not use drugs. Allergies:  Allergies  Allergen Reactions  . Infliximab Shortness Of Breath    Respiratory complications.  . Vicodin [Hydrocodone-Acetaminophen] Nausea And Vomiting   Medications Prior to Admission  Medication Sig Dispense Refill  . albuterol (PROVENTIL HFA;VENTOLIN HFA) 108 (90 Base) MCG/ACT inhaler Inhale 1-2 puffs into the lungs every 6 (six) hours as needed for wheezing or shortness of breath.    Marland Kitchen atenolol (TENORMIN) 25 MG tablet Take 1 tablet (25 mg total) by mouth at bedtime. (Patient taking differently: Take 12.5 mg by mouth 2 (two) times daily. )  90 tablet 3  . budesonide-formoterol (SYMBICORT) 160-4.5 MCG/ACT inhaler Inhale 2 puffs into the lungs 3 (three) times daily.    . chlorthalidone (HYGROTON) 25 MG tablet Take 25 mg by mouth daily.    . cholecalciferol (VITAMIN D) 1000 units tablet Take 1,000 Units  by mouth daily.    . citalopram (CELEXA) 20 MG tablet Take 1 tablet by mouth daily.    Marland Kitchen. doxycycline (VIBRA-TABS) 100 MG tablet Take 100 mg by mouth 2 (two) times daily.  0  . guaiFENesin (MUCINEX) 600 MG 12 hr tablet Take 600 mg by mouth 2 (two) times daily as needed for cough.    Marland Kitchen. ibuprofen (ADVIL,MOTRIN) 200 MG tablet Take 200 mg by mouth every 6 (six) hours as needed for moderate pain.    Marland Kitchen. levothyroxine (SYNTHROID, LEVOTHROID) 150 MCG tablet Take 1 tablet by mouth daily.    . Multiple Vitamin (MULTIVITAMIN) tablet Take 1 tablet by mouth daily.    . pramipexole (MIRAPEX) 1.5 MG tablet Take 1.5 mg by mouth at bedtime. Take 1/2 tablet at noon and 1 tablet at bedtime    . predniSONE (DELTASONE) 10 MG tablet Take 10-40 mg by mouth See admin instructions. 40mg  for 3 days, 30mg  for 3 days, 20mg  for 3 days, 10mg  for 3 days  0  . tiotropium (SPIRIVA) 18 MCG inhalation capsule Place 18 mcg into inhaler and inhale daily.       Home: Home Living Family/patient expects to be discharged to:: Unsure  Functional History: Prior Function Level of Independence: Independent with assistive device(s), Needs assistance Gait / Transfers Assistance Needed: uses rollator around ALF. Typically able to ambulate to elevators (lives on the 3rd floor), then to the activity room or dining hall.  ADL's / Homemaking Assistance Needed: modified independent. Only showers 2x/wk due to amount of effort it requires LB ADL are difficult for her to cmoplete at baseline Functional Status:  Mobility: Bed Mobility Overal bed mobility: Needs Assistance Bed Mobility: Sit to Supine Sit to supine: Min guard General bed mobility comments: increased SOB with bed mobility Transfers Overall transfer level: Needs assistance Equipment used: Rolling walker (2 wheeled) Transfers: Sit to/from Stand Sit to Stand: Min guard General transfer comment: Pt states she "just feels unsteady"      ADL: ADL Overall ADL's : Needs  assistance/impaired Eating/Feeding: Independent Grooming: Set up, Standing Upper Body Bathing: Set up, Supervision/ safety, Sitting Lower Body Bathing: Moderate assistance, Sit to/from stand Upper Body Dressing : Supervision/safety, Set up, Sitting Lower Body Dressing: Moderate assistance, Sit to/from stand Toilet Transfer: Minimal assistance, Ambulation, RW, BSC Toileting- Clothing Manipulation and Hygiene: Set up, Supervision/safety, Sit to/from stand Functional mobility during ADLs: Minimal assistance, Rolling walker General ADL Comments: Able to ambulate @ 80 ft @ RW level  Cognition: Cognition Overall Cognitive Status: Within Functional Limits for tasks assessed Orientation Level: Oriented X4 Cognition Arousal/Alertness: Awake/alert Behavior During Therapy: WFL for tasks assessed/performed Overall Cognitive Status: Within Functional Limits for tasks assessed  Blood pressure (!) 176/96, pulse 89, temperature 97.4 F (36.3 C), temperature source Oral, resp. rate 20, height 5\' 6"  (1.676 m), weight 93.3 kg (205 lb 9.6 oz), SpO2 99 %. Physical Exam  Vitals reviewed. Constitutional: She appears well-developed.  83 year old right-handed female short of breath with oxygen in place  HENT:  Head: Normocephalic and atraumatic.  Eyes: Conjunctivae and EOM are normal.  Neck: Normal range of motion. Neck supple. No thyromegaly present.  Cardiovascular: Normal rate and regular rhythm.   Respiratory:  Decreased breath  sounds at the bases but clear to auscultation Increased WOB +[place]  GI: Soft. Bowel sounds are normal. She exhibits no distension.  Musculoskeletal: She exhibits no edema or tenderness.  Neurological: She is alert.  She easily becomes short of breath while talking during conversation.  A&Ox2 Follows all commands. Motor: 4/5 throughout  Skin: Skin is warm and dry.  Psychiatric: Her mood appears anxious.  Confused    Results for orders placed or performed during the  hospital encounter of 10/21/16 (from the past 24 hour(s))  Basic metabolic panel     Status: Abnormal   Collection Time: 10/27/16  4:56 AM  Result Value Ref Range   Sodium 136 135 - 145 mmol/L   Potassium 4.7 3.5 - 5.1 mmol/L   Chloride 84 (L) 101 - 111 mmol/L   CO2 43 (H) 22 - 32 mmol/L   Glucose, Bld 141 (H) 65 - 99 mg/dL   BUN 23 (H) 6 - 20 mg/dL   Creatinine, Ser 4.09 0.44 - 1.00 mg/dL   Calcium 9.2 8.9 - 81.1 mg/dL   GFR calc non Af Amer >60 >60 mL/min   GFR calc Af Amer >60 >60 mL/min   Anion gap 9 5 - 15   No results found.  Assessment/Plan: Diagnosis: Debilitation  Labs and images independently reviewed.  Records reviewed and summated above.  1. Does the need for close, 24 hr/day medical supervision in concert with the patient's rehab needs make it unreasonable for this patient to be served in a less intensive setting? Potentially  2. Co-Morbidities requiring supervision/potential complications: hyperlipidemia (cont meds), HTN (monitor and provide prns in accordance with increased physical exertion and pain), COPD (cont home supplemental O2), anxiety (ensure anxiety and resulting apprehension do not limit functional progress; consider prn medications if warranted), pain (Biofeedback training with therapies to help reduce reliance on opiate pain medications, particularly IV Morphine, monitor pain control during therapies, and sedation at rest and titrate to maximum efficacy to ensure participation and gains in therapies), depression (ensure mood does not hinder progress of therapies) 3. Due to safety, disease management, medication administration and patient education, does the patient require 24 hr/day rehab nursing? Yes 4. Does the patient require coordinated care of a physician, rehab nurse, PT (1-2 hrs/day, 5 days/week) and OT (1-2 hrs/day, 5 days/week) to address physical and functional deficits in the context of the above medical diagnosis(es)? Yes Addressing deficits in the  following areas: balance, endurance, locomotion, strength, transferring, bathing, toileting and psychosocial support 5. Can the patient actively participate in an intensive therapy program of at least 3 hrs of therapy per day at least 5 days per week? Potentially 6. The potential for patient to make measurable gains while on inpatient rehab is good 7. Anticipated functional outcomes upon discharge from inpatient rehab are supervision  with PT, supervision with OT, modified independent and supervision with SLP. 8. Estimated rehab length of stay to reach the above functional goals is: 10-14 days. 9. Does the patient have adequate social supports and living environment to accommodate these discharge functional goals? Potentially 10. Anticipated D/C setting: Home 11. Anticipated post D/C treatments: HH therapy and Home excercise program 12. Overall Rehab/Functional Prognosis: good  RECOMMENDATIONS: This patient's condition is appropriate for continued rehabilitative care in the following setting: Will await palliative care consult and recs.  If pt decides she would like to pursue CIR, would consider short IRF stay to maximize independence prior to discharge.  Patient has agreed to participate in recommended program. Potentially  hospice Note that insurance prior authorization may be required for reimbursement for recommended care.  Comment: Rehab Admissions Coordinator to follow up.  Charlton Amor., PA-C 10/27/2016  Maryla Morrow, MD, [place] [facility]

## 2016-10-27 NOTE — Progress Notes (Signed)
RT NOTE:  RT educated and advised patient to use Flutter 10x/hr while awake. PT understands and agrees.

## 2016-10-27 NOTE — Evaluation (Signed)
Physical Therapy Evaluation Patient Details Name: Teresa Weiss MRN: [id number] DOB: 04/02/1933 Today's Date: 10/27/2016   History of Present Illness  83 y.o. female with medical history significant of oxygen dependent COPD, depression, diverticulosis, hypertension, hyperlipidemia, psoriasis, urinary incontinence, presenting with 3-4 day history of worsening shortness of breath.Pt with COPD exacerbation and RSV.   Clinical Impression  Pt currently with decreased functional activity tolerance and decreased safety awareness. During PT session pt needing cues for safety considerations with transfers and ambulation. Pt was able to ambulate 50 ft with rw on 6LO2 with SpO2 90% upon return (required 1 standing rest). Pt is motivated to return home but willing to consider options for short term rehabilitation. Recommending CIR following acute stay but if pt not appropriate, would recommend short stay SNF for further rehabilitation before ultimately returning to her apartment at independent living facility. Pt will benefit from skilled PT to increase their independence and safety with mobility.      Follow Up Recommendations CIR;Supervision for mobility/OOB    Equipment Recommendations  None recommended by PT    Recommendations for Other Services       Precautions / Restrictions Precautions Precautions: Fall Precaution Comments: watch O2 Sats Restrictions Weight Bearing Restrictions: No      Mobility  Bed Mobility Overal bed mobility: Needs Assistance Bed Mobility: Supine to Sit     Supine to sit: Supervision     General bed mobility comments: HOB elevated, using rail to assist  Transfers Overall transfer level: Needs assistance Equipment used: Rolling walker (2 wheeled) Transfers: Sit to/from Stand Sit to Stand: Min guard         General transfer comment: repeated cues needed for safety with rw and hand placement.  Wheels of rw getting caught on [facility] and needing  assistance to correct.   Ambulation/Gait Ambulation/Gait assistance: Min guard Ambulation Distance (Feet): 50 Feet Assistive device: Rolling walker (2 wheeled) Gait Pattern/deviations: Step-through pattern;Decreased stride length Gait velocity: decreased   General Gait Details: Pt taking 1 standing rest, using 6L O2 with SpO2 90% aftter ambulation. Pt confirms feeling mildly unsteady during ambulation and increasing with fatigue.   Stairs            Wheelchair Mobility    Modified Rankin (Stroke Patients Only)       Balance Overall balance assessment: Needs assistance Sitting-balance support: No upper extremity supported Sitting balance-Leahy Scale: Good     Standing balance support: Single extremity supported Standing balance-Leahy Scale: Fair Standing balance comment: able to stand static without UE support but needing support if performing ADLs.                              Pertinent Vitals/Pain Pain Assessment: No/denies pain    Home Living Family/patient expects to be discharged to:: Unsure                 Additional Comments: Pt lives at independent living facility. Pt wanting to ultimately get home.     Prior Function Level of Independence: Independent with assistive device(s);Needs assistance   Gait / Transfers Assistance Needed: using rollator for ambulation to meals and longer distances. Describes not using any device in her apartment.   ADL's / Homemaking Assistance Needed: makes her own breakfast but goes for lunch/dinner. States that she does her own dressing.         Hand Dominance        Extremity/Trunk Assessment  Upper Extremity Assessment Upper Extremity Assessment: Generalized weakness    Lower Extremity Assessment Lower Extremity Assessment: Generalized weakness       Communication   Communication: No difficulties  Cognition Arousal/Alertness: Awake/alert Behavior During Therapy: WFL for tasks  assessed/performed Overall Cognitive Status: Within Functional Limits for tasks assessed                 General Comments: Pt occasionally confused conversationally needing clarification on topic    General Comments General comments (skin integrity, edema, etc.): Pt requiring assistance to pull up/down under garments to use BSC.     Exercises     Assessment/Plan    PT Assessment Patient needs continued PT services  PT Problem List Decreased strength;Decreased range of motion;Decreased activity tolerance;Decreased balance;Decreased mobility;Decreased safety awareness          PT Treatment Interventions DME instruction;Gait training;Functional mobility training;Therapeutic activities;Therapeutic exercise;Patient/family education    PT Goals (Current goals can be found in the Care Plan section)  Acute Rehab PT Goals Patient Stated Goal: be able to get back to her home PT Goal Formulation: With patient Time For Goal Achievement: 11/10/16 Potential to Achieve Goals: Good    Frequency Min 3X/week   Barriers to discharge        Co-evaluation               End of Session Equipment Utilized During Treatment: Gait belt;Oxygen Activity Tolerance: Patient limited by fatigue Patient left: in chair;with call bell/phone within reach;with chair alarm set Nurse Communication: Mobility status         Time: [id number] PT Time Calculation (min) (ACUTE ONLY): 41 min   Charges:   PT Evaluation $PT Eval Moderate Complexity: 1 Procedure PT Treatments $Gait Training: 8-22 mins $Therapeutic Activity: 8-22 mins   PT G Codes:        Christiane HaBenjamin J. Arron Tetrault, PT, CSCS Pager [phone] Office [phone]  10/27/2016, 9:57 AM

## 2016-10-27 NOTE — Progress Notes (Signed)
Inpatient Rehabilitation  I met with the patient at the bedside to discuss the results of the IP Rehab consult.  I provided informational booklets and answered her questions about IP rehab.  Pt. Is open to a CIR admission if [facility] Medicare will approve. I will initiate insurance authorization process.  Please call if questions.  [place] Admissions Coordinator Cell [phone] Office [phone]

## 2016-10-27 NOTE — Progress Notes (Signed)
RT NOTE:  Pt will have RN call when she is ready for CPAP. Pt does not wish to take midnight breathing treatment. RT will check with patient for 0400 treatment. RT will monitor.

## 2016-10-27 NOTE — Progress Notes (Signed)
Pt wants her son with her when anyone is talking about her transition home

## 2016-10-28 LAB — BASIC METABOLIC PANEL
Anion gap: 6 (ref 5–15)
BUN: 23 mg/dL — ABNORMAL HIGH (ref 6–20)
CALCIUM: 9.3 mg/dL (ref 8.9–10.3)
CO2: 44 mmol/L — AB (ref 22–32)
CREATININE: 0.67 mg/dL (ref 0.44–1.00)
Chloride: 87 mmol/L — ABNORMAL LOW (ref 101–111)
GFR calc Af Amer: >60 mL/min (ref >60)
GFR calc non Af Amer: >60 mL/min (ref >60)
GLUCOSE: 174 mg/dL — AB (ref 65–99)
Potassium: 4.4 mmol/L (ref 3.5–5.1)
Sodium: 137 mmol/L (ref 135–145)

## 2016-10-28 MED ORDER — ALBUTEROL SULFATE (2.5 MG/3ML) 0.083% IN NEBU: 2.5000 mg | INHALATION_SOLUTION | RESPIRATORY_TRACT | Status: DC | PRN

## 2016-10-28 MED ORDER — CITALOPRAM HYDROBROMIDE 10 MG PO TABS
10.0000 mg | ORAL_TABLET | Freq: Every day | ORAL | Status: DC
  Administered 2016-10-28 – 2016-10-29 (×2): 10 mg via ORAL
  Filled 2016-10-28 (×2): qty 1

## 2016-10-28 MED ORDER — IPRATROPIUM-ALBUTEROL 0.5-2.5 (3) MG/3ML IN SOLN
3.0000 mL | Freq: Four times a day (QID) | RESPIRATORY_TRACT | Status: DC
  Administered 2016-10-28 – 2016-10-29 (×6): 3 mL via RESPIRATORY_TRACT
  Filled 2016-10-28 (×6): qty 3

## 2016-10-28 NOTE — Progress Notes (Signed)
[facility] Progress Note  Patient: Teresa Weiss:[id number]   PCP: Shirline Frees, NP DOB: 09/18/1933   DOA: 10/21/2016   DOS: 10/28/2016   Date of Service: the patient was seen and examined on 10/28/2016  Brief hospital course: Teresa Weiss is a 83 yo female with PMHx of chronic respiratory failure, oxygen dependent COPD, OSA on CPAP at night, hypertension, hyperlipidemia, presented with 3 four-day history of worsening shortness of breath, coughing, wheezing. Upon presentation to the hospital, patient was placed on BiPAP for acute on chronic respiratory failure. Currently the patient is tolerating nasal cannula at 6 L and C Pap and feels her shortness of breath is getting better. Palliative care consulted for further assistance in management. Currently the plan is to go to rehabilitation for short-term and follow the course at home with possible care Connection or Hospice.  Assessment and Plan: Acute on chronic hypoxemic and hypercapnic respiratory failure secondary to COPD exacerbation and RSV  - Baseline on 4 L O2 at rest and 6 L on exertion - Off Bipap, continue CPAP daily at bedtime and when necessary Change IV Solu-Medrol to oral prednisone, continue nebs   Completed levofloxacin, last day 10/27/2016 total of 7 days Morphine makes her shortness of breath better, no other medication or treatment. PTOT recommends CIR, appreciate input. S/P one dose of IV Lasix. No further diuresis required at present  Chest tightness: likely from COPD exacerbation  - Echo: EF 60-65%, poor image quality no apical views. Cosider f/u TEE or MRI Aorta is moderately dilated and should have f/u imaging as outpatient.  Monitor.   Hyponatremia getting better - Patient was placed on gentle hydration at the time of admission, sodium trending down, hold IV fluids. Urine studies consistent with SIADH. Urine osmol 818, urine Na 67.  - Hold chlorthalidone,  Resume low-dose  Celexa  - Fluid restriction - Trend daily BMP  - Patient was given sodium tablets. Stop it on 10/26/2016  HTN:  - Continue Atenolol Discontinue chlorthalidone on discharge given well control blood pressure.  Hypothyroidism: - Continue synthroid  Depression/RLS: - Continue Mirapex  Goals of care discussion. appreciate palliative care input. Currently on morphine when necessary for symptom control. Palliative care considering possible MS Contin use as well. With patient's deconditioning PT OT consulted, patient is refusing to go to SNF. Unsure whether the family will be able to arrange 24 7 care for the patient at home. IP rehab consulted by palliative care  Bowel regimen: last BM 10/19/2016 miralax twice a day, Senokot twice a day, patient refusing stool softeners, patient finally agreed. We will monitor. Diet: cardiac diet DVT Prophylaxis: subcutaneous Heparin  Advance goals of care discussion: DNR DNI, palliative care discussed with patient and the family. At present the patient will be a good candidate for home with hospice. Patient shows some recovery but my concern is regarding her mobility at home as well as symptom control. Physical patient will benefit from IP rehabilitation in the short-term to improve her long-term outlook and quality of life.  Family Communication: no family was present at bedside, at the time of interview.   Disposition:  Discharge to home with hospice versus SNF with palliative care support. Expected discharge date: 10/29/2016  Consultants: palliative care Procedures: none  Antibiotics: Anti-infectives    Start     Dose/Rate Route Frequency Ordered Stop   10/23/16 1100  levofloxacin (LEVAQUIN) tablet 750 mg  Status:  Discontinued     750 mg Oral Daily 10/23/16  0708 10/28/16 1041   10/21/16 1100  levofloxacin (LEVAQUIN) IVPB 750 mg  Status:  Discontinued     750 mg 100 mL/hr over 90 Minutes Intravenous Every 24 hours 10/21/16 1037  10/23/16 0708     Subjective: Agreed to take the stool softener, denies any abdominal pain or nausea or vomiting.  Objective: Physical Exam: Vitals:   10/28/16 0000 10/28/16 0445 10/28/16 0856 10/28/16 1452  BP:  132/87  137/90  Pulse: 89 82  (!) 103  Resp: 18 20  20   Temp:  98.3 F (36.8 C)  98.1 F (36.7 C)  TempSrc:  Oral  Oral  SpO2: 98% 97% 93% 97%  Weight:  86.8 kg (191 lb 5.8 oz)    Height:        Intake/Output Summary (Last 24 hours) at 10/28/16 1721 Last data filed at 10/28/16 1453  Gross per 24 hour  Intake              240 ml  Output              400 ml  Net             -160 ml   Filed Weights   10/25/16 0500 10/26/16 0507 10/28/16 0445  Weight: 91.7 kg (202 lb 1.6 oz) 93.3 kg (205 lb 9.6 oz) 86.8 kg (191 lb 5.8 oz)    General: Alert, Awake and Oriented to Time, Place and Person. Appear in moderate distress, affect appropriate Eyes: PERRL, Conjunctiva normal ENT: Oral Mucosa clear moist. Neck: difficult to assess JVD, no Abnormal Mass Or lumps Cardiovascular: S1 and S2 Present, no Murmur, Respiratory: Bilateral Air entry equal and Decreased, no use of accessory muscle, basal Crackles, Occasional wheezes Abdomen: Bowel Sound present, Soft and no tenderness Skin: no redness, no Rash, no induration Extremities: no Pedal edema, no calf tenderness Neurologic: Grossly no focal neuro deficit. Bilaterally Equal motor strength  Data Reviewed: CBC:  Recent Labs Lab 10/22/16 0555 10/23/16 0620 10/24/16 0438 10/25/16 0331  WBC 4.8 4.3 3.8* 3.7*  NEUTROABS  --   --   --  3.1  HGB 13.2 12.3 13.6 12.5  HCT 39.5 38.1 41.2 38.1  MCV 91.0 93.6 92.8 92.0  PLT 270 241 241 210   Basic Metabolic Panel:  Recent Labs Lab 10/24/16 1509 10/25/16 0331 10/26/16 0351 10/27/16 0456 10/28/16 0504  NA 128* 130* 133* 136 137  K 4.3 3.8 4.0 4.7 4.4  CL 81* 79* 84* 84* 87*  CO2 39* 41* 41* 43* 44*  GLUCOSE 131* 130* 220* 141* 174*  BUN 25* 22* 23* 23* 23*    CREATININE 0.61 0.65 0.74 0.74 0.67  CALCIUM 9.3 9.0 9.1 9.2 9.3  MG  --  1.8  --   --   --     Liver Function Tests: No results for input(s): AST, ALT, ALKPHOS, BILITOT, PROT, ALBUMIN in the last 168 hours. No results for input(s): LIPASE, AMYLASE in the last 168 hours. No results for input(s): AMMONIA in the last 168 hours. Coagulation Profile: No results for input(s): INR, PROTIME in the last 168 hours. Cardiac Enzymes:  Recent Labs Lab 10/21/16 1723  TROPONINI <0.03   BNP (last 3 results) No results for input(s): PROBNP in the last 8760 hours.  CBG: No results for input(s): GLUCAP in the last 168 hours.  Studies: No results found.   Scheduled Meds: . arformoterol  15 mcg Nebulization BID  . atenolol  25 mg Oral QHS  . budesonide (PULMICORT)  nebulizer solution  0.25 mg Nebulization BID  . enoxaparin (LOVENOX) injection  40 mg Subcutaneous Q24H  . guaiFENesin  1,200 mg Oral BID  . ipratropium-albuterol  3 mL Nebulization QID  . levothyroxine  150 mcg Oral QAC breakfast  . mirtazapine  15 mg Oral QHS  . mometasone-formoterol  2 puff Inhalation BID  . polyethylene glycol  17 g Oral BID  . pramipexole  0.75 mg Oral Q1200   And  . pramipexole  1.5 mg Oral QHS  . predniSONE  40 mg Oral BID WC  . senna-docusate  2 tablet Oral BID  . sodium chloride flush  3 mL Intravenous Q12H  . tiotropium  18 mcg Inhalation Daily   Continuous Infusions: . albuterol Stopped (10/21/16 0835)   PRN Meds: sodium chloride, acetaminophen **OR** acetaminophen, albuterol, gi cocktail, LORazepam, morphine injection, morphine CONCENTRATE, ondansetron **OR** ondansetron (ZOFRAN) IV, oxymetazoline, sodium chloride, sodium chloride flush  Time spent: 30 minutes  Author: Lynden OxfordPranav Adis Sturgill, MD [facility] Pager: [phone] 10/28/2016 5:21 PM  If 7PM-7AM, please contact night-coverage at [email or web address], password [email or web address]

## 2016-10-28 NOTE — Progress Notes (Signed)
Occupational Therapy Treatment Patient Details Name: Teresa KnudsenMarlene Evonne Boughton Weiss MRN: [id number] DOB: 02/16/1933 Today's Date: 10/28/2016    History of present illness 83 y.o. female with medical history significant of oxygen dependent COPD, depression, diverticulosis, hypertension, hyperlipidemia, psoriasis, urinary incontinence, presenting with 3-4 day history of worsening shortness of breath.Pt with COPD exacerbation and RSV.    OT comments  Pt tolerated increased functional mobility today with one standing rest break. Unable to obtain pulse ox reading, DOE 2/4 on 4-5L throughout session. Educated pt on energy conservation strategies and pursed lip breathing; pt verbalized understanding and implemented breathing strategies. D/c plan remains appropriate. If pt unable to d/c to CIR she prefers home with [facility] follow up (would recommend HHPT/OT/aide). Will continue to follow acutely.   Follow Up Recommendations  CIR    Equipment Recommendations  3 in 1 bedside commode    Recommendations for Other Services      Precautions / Restrictions Precautions Precautions: Fall Precaution Comments: watch O2 Sats Restrictions Weight Bearing Restrictions: No       Mobility Bed Mobility               General bed mobility comments: Pt sitting EOB upon arrival.  Transfers Overall transfer level: Needs assistance Equipment used: Rolling walker (2 wheeled) Transfers: Sit to/from Stand Sit to Stand: Min guard         General transfer comment: Cues for hand placement and locking rollator prior to transfer.    Balance Overall balance assessment: Needs assistance Sitting-balance support: Feet supported;No upper extremity supported Sitting balance-Leahy Scale: Good     Standing balance support: Bilateral upper extremity supported Standing balance-Leahy Scale: Fair                     ADL Overall ADL's : Needs assistance/impaired                         Toilet  Transfer: Min guard;Ambulation;RW Toilet Transfer Details (indicate cue type and reason): Simulated by sit to stand from EOB with functional mobility in room         Functional mobility during ADLs: Min guard;Rolling walker General ADL Comments: Pt declining ADL at this time, reports she showered this morning and feels much better, would like to walk. Educated pt on energy conservation strategies during functional mobilty. DOE 2/4. Unable to obtain pulse ox reading pt on 4-5L O2 thorughout.      Vision                     Perception     Praxis      Cognition   Behavior During Therapy: WFL for tasks assessed/performed Overall Cognitive Status: Within Functional Limits for tasks assessed                       Extremity/Trunk Assessment               Exercises     Shoulder Instructions       General Comments      Pertinent Vitals/ Pain       Pain Assessment: No/denies pain  Home Living                                          Prior Functioning/Environment  Frequency  Min 2X/week        Progress Toward Goals  OT Goals(current goals can now be found in the care plan section)  Progress towards OT goals: Progressing toward goals  Acute Rehab OT Goals Patient Stated Goal: be able to get back to her home OT Goal Formulation: With patient  Plan Discharge plan remains appropriate    Co-evaluation                 End of Session Equipment Utilized During Treatment: Gait belt;Rolling walker;Oxygen   Activity Tolerance Patient tolerated treatment well   Patient Left with call bell/phone within reach;with family/visitor present (sitting EOB)   Nurse Communication          Time: 1610-9604 OT Time Calculation (min): 16 min  Charges: OT General Charges $OT Visit: 1 Procedure OT Treatments $Therapeutic Activity: 8-22 mins  Gaye Alken M.S., OTR/L Pager: [phone]  10/28/2016, 4:07  PM

## 2016-10-28 NOTE — Progress Notes (Signed)
Inpatient Rehabilitation  Following along for potential IP Rehab admission.  [facility] and plan to update the team as I know.  Please call with questions.   Charlane FerrettiMelissa Ade Stmarie, M.A., CCC/SLP Admission Coordinator  [facility]  Cell [phone]

## 2016-10-29 LAB — BASIC METABOLIC PANEL
ANION GAP: 10 (ref 5–15)
BUN: 23 mg/dL — AB (ref 6–20)
CO2: 41 mmol/L — ABNORMAL HIGH (ref 22–32)
Calcium: 9.1 mg/dL (ref 8.9–10.3)
Chloride: 84 mmol/L — ABNORMAL LOW (ref 101–111)
Creatinine, Ser: 0.69 mg/dL (ref 0.44–1.00)
Glucose, Bld: 159 mg/dL — ABNORMAL HIGH (ref 65–99)
POTASSIUM: 5 mmol/L (ref 3.5–5.1)
SODIUM: 135 mmol/L (ref 135–145)

## 2016-10-29 MED ORDER — CITALOPRAM HYDROBROMIDE 10 MG PO TABS: 10.0000 mg | ORAL_TABLET | Freq: Every day | ORAL | 0 refills | Status: DC

## 2016-10-29 MED ORDER — SENNOSIDES-DOCUSATE SODIUM 8.6-50 MG PO TABS: 2.0000 | ORAL_TABLET | Freq: Every day | ORAL | 0 refills | Status: AC

## 2016-10-29 MED ORDER — FUROSEMIDE 20 MG PO TABS: 20.0000 mg | ORAL_TABLET | Freq: Every day | ORAL | 0 refills | Status: DC

## 2016-10-29 MED ORDER — OXYMETAZOLINE HCL 0.05 % NA SOLN
1.0000 | Freq: Two times a day (BID) | NASAL | 0 refills | Status: DC | PRN
Start: 2016-10-29 — End: 2016-12-07

## 2016-10-29 MED ORDER — FUROSEMIDE 20 MG PO TABS: 20.0000 mg | ORAL_TABLET | Freq: Every day | ORAL | Status: DC

## 2016-10-29 MED ORDER — GUAIFENESIN ER 600 MG PO TB12: 1200.0000 mg | ORAL_TABLET | Freq: Two times a day (BID) | ORAL | 0 refills | Status: AC

## 2016-10-29 MED ORDER — SALINE SPRAY 0.65 % NA SOLN: 1.0000 | NASAL | 0 refills | Status: AC | PRN

## 2016-10-29 MED ORDER — IPRATROPIUM-ALBUTEROL 0.5-2.5 (3) MG/3ML IN SOLN: 3.0000 mL | Freq: Four times a day (QID) | RESPIRATORY_TRACT | 0 refills | Status: AC

## 2016-10-29 MED ORDER — PREDNISONE 10 MG PO TABS: ORAL_TABLET | ORAL | 0 refills | Status: DC

## 2016-10-29 MED ORDER — MIRTAZAPINE 15 MG PO TABS: 15.0000 mg | ORAL_TABLET | Freq: Every day | ORAL | 0 refills | Status: DC

## 2016-10-29 MED ORDER — MORPHINE SULFATE (CONCENTRATE) 10 MG/0.5ML PO SOLN: 10.0000 mg | ORAL | 0 refills | Status: AC | PRN

## 2016-10-29 MED ORDER — POLYETHYLENE GLYCOL 3350 17 G PO PACK: 17.0000 g | PACK | Freq: Two times a day (BID) | ORAL | 0 refills | Status: AC

## 2016-10-29 NOTE — Progress Notes (Signed)
Physical Therapy Treatment Patient Details Name: Teresa Weiss MRN: [id number] DOB: 09/30/1933 Today's Date: 10/29/2016    History of Present Illness 83 y.o. female with medical history significant of oxygen dependent COPD, depression, diverticulosis, hypertension, hyperlipidemia, psoriasis, urinary incontinence, presenting with 3-4 day history of worsening shortness of breath.Pt with COPD exacerbation and RSV.     PT Comments    Pt tolerated increased distance of 170' with ambulation today. She performed standing LE exercises. Unable to obtain ambulating SaO2 sats as pulse oximeter did not give a reading. Due to significant progress with mobility, PT now recommending HHPT.   Follow Up Recommendations  HHPT     Equipment Recommendations  None recommended by PT    Recommendations for Other Services       Precautions / Restrictions Precautions Precautions: Fall Precaution Comments: watch O2 Sats Restrictions Weight Bearing Restrictions: No    Mobility  Bed Mobility Overal bed mobility: Modified Independent Bed Mobility: Sit to Supine       Sit to supine: Modified independent (Device/Increase time)   General bed mobility comments: HOB elevated, using rail to assist  Transfers Overall transfer level: Needs assistance Equipment used: Rolling walker (2 wheeled) Transfers: Sit to/from Stand Sit to Stand: Supervision         General transfer comment: Cues for hand placement   Ambulation/Gait Ambulation/Gait assistance: Supervision Ambulation Distance (Feet): 170 Feet Assistive device: Rolling walker (2 wheeled) Gait Pattern/deviations: Step-through pattern;Decreased stride length   Gait velocity interpretation: at or above normal speed for age/gender General Gait Details: pt ambulated on 6L O2 [place], pulse oximeter did not give a reading while ambulating, no LOB, 1/4 dyspnea   Stairs            Wheelchair Mobility    Modified Rankin (Stroke  Patients Only)       Balance Overall balance assessment: Needs assistance Sitting-balance support: Feet supported;No upper extremity supported Sitting balance-Leahy Scale: Good     Standing balance support: Bilateral upper extremity supported Standing balance-Leahy Scale: Fair                      Cognition Arousal/Alertness: Awake/alert Behavior During Therapy: WFL for tasks assessed/performed Overall Cognitive Status: Within Functional Limits for tasks assessed                      Exercises General Exercises - Lower Extremity Hip Flexion/Marching: AROM;Both;10 reps;Standing Heel Raises: AROM;Both;Standing;10 reps Mini-Sqauts: AROM;15 reps;Standing;Both    General Comments        Pertinent Vitals/Pain Pain Score: 2  Pain Location: abdomen  Pain Descriptors / Indicators: Discomfort ("gas pain" 2* no BM in a week) Pain Intervention(s): Monitored during session    Home Living                      Prior Function            PT Goals (current goals can now be found in the care plan section) Acute Rehab PT Goals Patient Stated Goal: return to ILF, likes to do oil painting and make beaded jewelry PT Goal Formulation: With patient Time For Goal Achievement: 11/10/16 Potential to Achieve Goals: Good Progress towards PT goals: Progressing toward goals    Frequency    Min 3X/week      PT Plan Plan updated, now recommending HHPT    Co-evaluation             End of Session  Equipment Utilized During Treatment: Gait belt;Oxygen Activity Tolerance: Patient tolerated treatment well Patient left: with call bell/phone within reach;in bed     Time: 0910-0929 PT Time Calculation (min) (ACUTE ONLY): 19 min  Charges:  $Gait Training: 8-22 mins                    G Codes:      Tamala SerUhlenberg, Trine Fread Kistler 10/29/2016, 9:37 AM  [phone]

## 2016-10-29 NOTE — Progress Notes (Signed)
Inpatient Rehabilitation  [facility]  I notified patient and son and discussed their options with them.  Given, patient's progress with PT this morning they feel that patient and return home with home health care.  Notified RN CM and MD.  No further IP Rehab needs at this time; will sign off.     Charlane FerrettiMelissa Jefry Lesinski, M.A., CCC/SLP Admission Coordinator  [facility]  Cell [phone]

## 2016-10-29 NOTE — Care Management Important Message (Signed)
Important Message  Patient Details  Name: Teresa Weiss MRN: [id number] Date of Birth: 07/14/1933   Medicare Important Message Given:  Yes    Jaimie Redditt Abena 10/29/2016, 5:10 PM

## 2016-11-02 ENCOUNTER — Telehealth: Payer: Self-pay

## 2016-11-02 ENCOUNTER — Telehealth: Payer: Self-pay | Admitting: Adult Health

## 2016-11-02 ENCOUNTER — Ambulatory Visit: Payer: Medicare Other | Admitting: Gastroenterology

## 2016-11-02 NOTE — Telephone Encounter (Signed)
° °  Rosanne AshingJim a PT with [facility] call to report that patient has  Diarrhea,recent left ear function, temp 99.1,  pt reports has problems  getting compresssion  hose to fit, pt will benefit with supportive shoes/insert, pt has attempted finding shoes in the past with no success

## 2016-11-02 NOTE — Telephone Encounter (Signed)
D/C 10/29/16 To: home  Spoke with pt and she states that she is improving. She feels better each day. She is having a family member come over today to help sort out her medications.   [facility] Pt aware.     Transition Care Management Follow-up Telephone Call  How have you been since you were released from the hospital? improving   Do you understand why you were in the hospital? yes   Do you understand the discharge instructions? yes  Items Reviewed:  Medications reviewed: yes  Allergies reviewed: yes  Dietary changes reviewed: yes  Referrals reviewed: yes   Functional Questionnaire:   Activities of Daily Living (ADLs):   She states they are independent in the following: ambulation, bathing and hygiene, feeding, continence, grooming, toileting and dressing States they require assistance with the following: driving   Any transportation issues/concerns?: no   Any patient concerns? no   Confirmed importance and date/time of follow-up visits scheduled: yes   Confirmed with patient if condition begins to worsen call PCP or go to the ER.  Patient was given the Call-a-Nurse line [phone]: yes

## 2016-11-02 NOTE — Telephone Encounter (Signed)
See below

## 2016-11-04 ENCOUNTER — Ambulatory Visit: Payer: Medicare Other | Admitting: Adult Health

## 2016-11-05 ENCOUNTER — Telehealth: Payer: Self-pay | Admitting: Adult Health

## 2016-11-05 NOTE — Telephone Encounter (Signed)
I called and spoke with Rosanne AshingJim. He states that he does not have an exact answer on how to write the order for the support shoes with lifts, but he states that he is unsure if the patient wants to move forward with this, so an office visit is recommended to ask the patient about this - he states that if she does want to move forward with the support shoes, then he recommends a referral to [employer]. He also states that the patient has mentioned to him that she has "colon issues" and she is having multiple lose bowel movements a day - he states that this is actually the reason that she [facility]

## 2016-11-05 NOTE — Telephone Encounter (Signed)
° ° °  Rosanne AshingJim with [facility] call to ask for skilled nursing evaluation for med management high risk for re hospitalization  Would like a call back     [phone]

## 2016-11-05 NOTE — Telephone Encounter (Signed)
Can you please call patient and see if she is willing to come in for a check up. 30 min appt please. Thank you!

## 2016-11-05 NOTE — Telephone Encounter (Signed)
That is fine. Can you ask Rosanne AshingJim how he needs me to write the order for support shoes with lifts

## 2016-11-05 NOTE — Telephone Encounter (Signed)
We should get her scheduled

## 2016-11-05 NOTE — Telephone Encounter (Signed)
Is this ok?

## 2016-11-06 NOTE — Discharge Summary (Signed)
[facility] Discharge Summary   Patient: Teresa Weiss ZOX:[id number]   PCP: Shirline Frees, NP DOB: September 19, 1933   Date of admission: 10/21/2016   Date of discharge: 10/29/2016     Discharge Diagnoses:  Active Problems:   COPD exacerbation (HCC)   Acute on chronic respiratory failure (HCC)   Chest tightness   Essential hypertension   Hypothyroidism   Depression   RLS (restless legs syndrome)   Palliative care by specialist   Goals of care, counseling/discussion   Encounter for hospice care discussion   Hyperlipidemia   Anxiety state   Pain   Admitted From: home Disposition:  Home with home health  Recommendations for Outpatient Follow-up:  1. Please follow up with pcp in 1 week   Follow-up Information    Shirline Frees, NP. Schedule an appointment as soon as possible for a visit in 1 week(s).   Specialty:  Family Medicine Why:  to decide whether to continue lasix or not, get BMP checked.  Contact information: [address] [place] [address] [phone]        Leslye Peer., MD. Nyra Capes on 11/09/2016.   Specialty:  Pulmonary Disease Contact information: [address] [place] [address] [phone]        [facility] Follow up.   Why:  Physical Therapy, Aide Contact information: [address] [place] [address] [phone]          Diet recommendation: cardiac diet  Activity: The patient is advised to gradually reintroduce usual activities.  Discharge Condition: good  Code Status: DNR DNI  History of present illness: As per the H and P dictated on admission, "Teresa Weiss is a 83 y.o. female with medical history significant of oxygen dependent COPD, depression, diverticulosis, hypertension, hyperlipidemia, psoriasis, urinary incontinence, presenting with 3-4 day history of worsening shortness of breath. Associated with cough and wheezing. States that she feels there is  phlegm in her chest but cannot cough it up. Inhalers with minimal relief at home. Doesn't endorse some chest tightness but denies actual chest pain or palpitations, diaphoresis, nausea, LOC. Patient was started on doxycycline 2 days ago as well as prednisone 40 mg. At baseline patient is on 4 L and sometimes increases her oxygen to 6 L when exerting herself. Denies fevers, abdominal pain, dysuria, frequency, back pain, rash, neck stiffness, headache. [facility] is [facility] Course:   Summary of her active problems in the hospital is as following. Acute on chronic hypoxemic and hypercapnic respiratory failure secondary to COPD exacerbation and RSV  - Baseline on 4 L O2 at restand 6 L on exertion - Off Bipap, continue CPAP daily at bedtime and when necessary Change IV Solu-Medrol to oral prednisone, continue nebs   Completed levofloxacin, last day 10/27/2016 total of 7 days Morphine makes her shortness of breath better, no other medication or treatment. S/P one dose of IV Lasix. Will send home on oral lasix., Pt was provided oxymizer but she and family refused.  Chest tightness: likely from COPD exacerbation  - Echo: EF 60-65%, poor image quality no apical views. Cosider f/u TEE or MRI Aorta is moderately dilated and should have f/u imaging as outpatient.  Monitor.   Hyponatremia getting better - Patient was placed on gentle hydration at the time of admission, sodium trending down, hold IV fluids. Urine studies consistent with SIADH. Urine osmol 818, urine Na 67.  -  Hold chlorthalidone,  - Resume low-dose Celexa  - Patient was given sodium tablets. Stop it on 10/26/2016  HTN:  - Continue Atenolol Discontinue chlorthalidone on discharge given well control blood pressure.  Hypothyroidism: - Continue synthroid  Depression/RLS: - Continue Mirapex  Goals of care discussion. appreciate palliative care input.    primarily consulted for symptom management  Currently on morphine when necessary for symptom control. With patient's deconditioning PT OT consulted, patient is refusing to go to SNF. With improvement in shortness of breath with treatment, pt's performance status also gotten better and only needed home health at the time of discharge. Plan is to maintain the pt on care connection program so that if she worsens down the road she can be transitioned to hospice.   All other chronic medical condition were stable during the hospitalization.  Patient was seen by physical therapy, who recommended home health, which was arranged by Child psychotherapistsocial worker and case Production designer, theatre/television/filmmanager. On the day of the discharge the patient's vitals were stable, her oxygenation was stable and she refused oxymizer, and no other acute medical condition were reported by patient. the patient was felt safe to be discharge at home with home health.  Procedures and Results:  none   Consultations:  Palliative care  DISCHARGE MEDICATION: Discharge Medication List as of 10/29/2016 12:53 PM    START taking these medications   Details  furosemide (LASIX) 20 MG tablet Take 1 tablet (20 mg total) by mouth daily., Starting Fri 10/29/2016, Normal    ipratropium-albuterol (DUONEB) 0.5-2.5 (3) MG/3ML SOLN Take 3 mLs by nebulization 4 (four) times daily., Starting Fri 10/29/2016, Normal    mirtazapine (REMERON) 15 MG tablet Take 1 tablet (15 mg total) by mouth at bedtime., Starting Fri 10/29/2016, Normal    Morphine Sulfate (MORPHINE CONCENTRATE) 10 MG/0.5ML SOLN concentrated solution Place 0.5 mLs (10 mg total) under the tongue every 4 (four) hours as needed for severe pain, anxiety or shortness of breath (dyspnea/air hunger)., Starting Fri 10/29/2016, Print    oxymetazoline (AFRIN) 0.05 % nasal spray Place 1 spray into both nostrils 2 (two) times daily as needed for congestion., Starting Fri 10/29/2016, Normal    polyethylene glycol (MIRALAX /  GLYCOLAX) packet Take 17 g by mouth 2 (two) times daily., Starting Fri 10/29/2016, Normal    senna-docusate (SENOKOT-S) 8.6-50 MG tablet Take 2 tablets by mouth at bedtime., Starting Fri 10/29/2016, Normal    sodium chloride (OCEAN) 0.65 % SOLN nasal spray Place 1 spray into both nostrils as needed for congestion., Starting Fri 10/29/2016, Normal      CONTINUE these medications which have CHANGED   Details  citalopram (CELEXA) 10 MG tablet Take 1 tablet (10 mg total) by mouth daily., Starting Sat 10/30/2016, Normal    guaiFENesin (MUCINEX) 600 MG 12 hr tablet Take 2 tablets (1,200 mg total) by mouth 2 (two) times daily., Starting Fri 10/29/2016, Normal    predniSONE (DELTASONE) 10 MG tablet Take 60mg  daily for 3day,Take 50mg  daily for 3day,Take 40mg  daily for 3day,Take 30mg  daily for 3day,Take 20mg  daily for 3day,Take 10mg  daily, Normal      CONTINUE these medications which have NOT CHANGED   Details  albuterol (PROVENTIL HFA;VENTOLIN HFA) 108 (90 Base) MCG/ACT inhaler Inhale 1-2 puffs into the lungs every 6 (six) hours as needed for wheezing or shortness of breath., Historical Med    atenolol (TENORMIN) 25 MG tablet Take 1 tablet (25 mg total) by mouth at bedtime., Starting Thu 10/07/2016, Print    budesonide-formoterol (SYMBICORT) 160-4.5 MCG/ACT  inhaler Inhale 2 puffs into the lungs 3 (three) times daily., Historical Med    cholecalciferol (VITAMIN D) 1000 units tablet Take 1,000 Units by mouth daily., Historical Med    ibuprofen (ADVIL,MOTRIN) 200 MG tablet Take 200 mg by mouth every 6 (six) hours as needed for moderate pain., Historical Med    levothyroxine (SYNTHROID, LEVOTHROID) 150 MCG tablet Take 1 tablet by mouth daily., Historical Med    Multiple Vitamin (MULTIVITAMIN) tablet Take 1 tablet by mouth daily., Historical Med    pramipexole (MIRAPEX) 1.5 MG tablet Take 1.5 mg by mouth at bedtime. Take 1/2 tablet at noon and 1 tablet at bedtime, Historical Med    tiotropium  (SPIRIVA) 18 MCG inhalation capsule Place 18 mcg into inhaler and inhale daily. , Historical Med      STOP taking these medications     chlorthalidone (HYGROTON) 25 MG tablet      doxycycline (VIBRA-TABS) 100 MG tablet        Allergies  Allergen Reactions  . Infliximab Shortness Of Breath    Respiratory complications.  . Vicodin [Hydrocodone-Acetaminophen] Nausea And Vomiting   Discharge Instructions    Diet - low sodium heart healthy    Complete by:  As directed    Discharge instructions    Complete by:  As directed    It is important that you read following instructions as well as go over your medication list with RN to help you understand your care after this hospitalization.  Discharge Instructions: Please follow-up with PCP in one week  Please request your primary care physician to go over all Hospital Tests and Procedure/Radiological results at the follow up,  Please get all Hospital records sent to your PCP by signing hospital release before you go home.   Do not drive, operating heavy machinery, perform activities at heights, swimming or participation in water activities or provide baby sitting services; until you have been seen by Primary Care Physician or a Neurologist and advised to do so again. Do not take more than prescribed Pain, Sleep and Anxiety Medications. You were cared for by a hospitalist during your hospital stay. If you have any questions about your discharge medications or the care you received while you were in the hospital after you are discharged, you can call the unit and ask to speak with the hospitalist on call if the hospitalist that took care of you is not available.  Once you are discharged, your primary care physician will handle any further medical issues. Please note that NO REFILLS for any discharge medications will be authorized once you are discharged, as it is imperative that you return to your primary care physician (or establish a  relationship with a primary care physician if you do not have one) for your aftercare needs so that they can reassess your need for medications and monitor your lab values. You Must read complete instructions/literature along with all the possible adverse reactions/side effects for all the Medicines you take and that have been prescribed to you. Take any new Medicines after you have completely understood and accept all the possible adverse reactions/side effects. Wear Seat belts while driving.   Increase activity slowly    Complete by:  As directed      Discharge Exam: Filed Weights   10/26/16 0507 10/28/16 0445 10/29/16 0612  Weight: 93.3 kg (205 lb 9.6 oz) 86.8 kg (191 lb 5.8 oz) 87.9 kg (193 lb 11.2 oz)   Vitals:   10/29/16 0736 10/29/16 1132  BP: (!) 141/78 137/77  Pulse: 74 74  Resp: (!) 22 20  Temp: 98.7 F (37.1 C) 98 F (36.7 C)   General: Appear in mild distress, no Rash; Oral Mucosa moist. Cardiovascular: S1 and S2 Present, no Murmur, no JVD Respiratory: Bilateral Air entry present and no Crackles, bilateral  wheezes Abdomen: Bowel Sound present, Soft and no tenderness Extremities: no Pedal edema, no calf tenderness Neurology: Grossly no focal neuro deficit.  The results of significant diagnostics from this hospitalization (including imaging, microbiology, ancillary and laboratory) are listed below for reference.    Significant Diagnostic Studies: Dg Chest 2 View  Result Date: 10/21/2016 CLINICAL DATA:  Increasing shortness of breath for 3 days. History of COPD. EXAM: CHEST  2 VIEW COMPARISON:  None. FINDINGS: Mild cardiac enlargement with normal pulmonary vascularity. Emphysematous changes and scattered fibrosis in the lungs. No focal airspace disease or consolidation. No blunting of costophrenic angles. No pneumothorax. Calcified and tortuous aorta. Degenerative changes in the spine. IMPRESSION: Cardiac enlargement. Emphysematous changes in the lungs. No evidence of  active pulmonary disease. Electronically Signed   By: Burman Nieves M.D.   On: 10/21/2016 05:24    Microbiology: No results found for this or any previous visit (from the past 240 hour(s)).   Labs: CBC: No results for input(s): WBC, NEUTROABS, HGB, HCT, MCV, PLT in the last 168 hours. Basic Metabolic Panel: No results for input(s): NA, K, CL, CO2, GLUCOSE, BUN, CREATININE, CALCIUM, MG, PHOS in the last 168 hours. Liver Function Tests: No results for input(s): AST, ALT, ALKPHOS, BILITOT, PROT, ALBUMIN in the last 168 hours. No results for input(s): LIPASE, AMYLASE in the last 168 hours. No results for input(s): AMMONIA in the last 168 hours. Cardiac Enzymes: No results for input(s): CKTOTAL, CKMB, CKMBINDEX, TROPONINI in the last 168 hours. BNP (last 3 results)  Recent Labs  10/21/16 0854  BNP 82.4   CBG: No results for input(s): GLUCAP in the last 168 hours. Time spent: 30 minutes  Signed:  Lynden Oxford  [facility] 10/29/2016   , 6:18 PM

## 2016-11-08 ENCOUNTER — Telehealth: Payer: Self-pay | Admitting: Adult Health

## 2016-11-08 NOTE — Telephone Encounter (Signed)
Rosanne AshingJim is calling to report on sat night patient  pulsimetry 62 % then went back up 96%. Pt thinks pieces for her cpap machine is missing. Jim left message for [facility] department  to call her. Pt is  demonstrating progress in therapy and would benefit from once a wk for 3 wks for functional mobility. Verbal is ok

## 2016-11-08 NOTE — Telephone Encounter (Signed)
Ok for verbal orders ?

## 2016-11-09 ENCOUNTER — Encounter: Payer: Self-pay | Admitting: Emergency Medicine

## 2016-11-09 ENCOUNTER — Telehealth: Payer: Self-pay | Admitting: Adult Health

## 2016-11-09 ENCOUNTER — Ambulatory Visit (INDEPENDENT_AMBULATORY_CARE_PROVIDER_SITE_OTHER): Payer: Medicare Other | Admitting: Emergency Medicine

## 2016-11-09 DIAGNOSIS — J449 Chronic obstructive pulmonary disease, unspecified: Secondary | ICD-10-CM

## 2016-11-09 DIAGNOSIS — G4733 Obstructive sleep apnea (adult) (pediatric): Secondary | ICD-10-CM | POA: Insufficient documentation

## 2016-11-09 DIAGNOSIS — J300 Vasomotor rhinitis: Secondary | ICD-10-CM | POA: Diagnosis not present

## 2016-11-09 MED ORDER — IPRATROPIUM BROMIDE 0.03 % NA SOLN: 2.0000 | Freq: Two times a day (BID) | NASAL | 6 refills | Status: DC

## 2016-11-09 NOTE — Telephone Encounter (Signed)
oxygen at 4-6L/min with a goal Spo2 of 91-93%.

## 2016-11-09 NOTE — Telephone Encounter (Signed)
Patty notified of Cory's comments.

## 2016-11-09 NOTE — Assessment & Plan Note (Signed)
She notes chronic clear drainage, happens all day long. She feels that this does impact her breathing and chest sx. She does use afrin occasionally, feels too harsh.  would like to try atrovent NS, see if she benefits from this. Use nasal saline to avoid drying out too much.

## 2016-11-09 NOTE — Progress Notes (Signed)
Subjective:    Patient ID: Teresa Weiss, female    DOB: 02/13/1933, 83 y.o.   MRN: [id number]  HPI 83 year old woman, former smoker (~45 pack years) with hx HTN, hypothyroidism, psoriatic arthritis, OSA on CPAP. She was dx with COPD several years ago, has been on home O2 since early 2000's, 4-6L/min. Her son is R. Lundy with [employer]. She was recently admitted 1/11-1/19/18 for an AE of her COPD. She was treated with steroids, BD's, brief BiPAP. Benefited from morphine for sx of dyspnea after being evaluated by Dr Phillips OdorGolding. Hospice care at some point in the future was discussed. Her code status was defined as DNR / DNI at that time. Based on her dscription it sounds like she has Home health but does not have Hospice. She presents today to establish care. Has been managed on Symbicort TID and Spiriva. She uses albuterol about 3-4x a day. She has had 2 exacerbations in the last year, but prior to that had been exacerbating much less frequently.   She believes that she is recovering but has not gotten back to her baseline since hospitalization. She is still tired, a bit weaker than her usual. Intermittently has some dyspnea - was SOB this am while getting ready for this visit. She isn't coughing, has minimal wheeze. She has a lot of clear nasal drainage, all day long. Has Afrin on her list for prn. She has been on flonase before without much effect.   Good compliance with her o2 and her CPAP.    CXR 10/21/16 some cardiac enlargement, emphysema, no evidence ILD.   Review of Systems  Constitutional: Negative.  Negative for fever and unexpected weight change.  HENT: Positive for postnasal drip and rhinorrhea. Negative for congestion, dental problem, ear pain, nosebleeds, sinus pressure, sneezing, sore throat and trouble swallowing.   Eyes: Negative.  Negative for redness and itching.  Respiratory: Positive for shortness of breath. Negative for cough, chest tightness and wheezing.     Cardiovascular: Positive for leg swelling. Negative for palpitations.  Gastrointestinal: Negative.  Negative for nausea and vomiting.  Genitourinary: Negative.  Negative for dysuria.  Musculoskeletal: Negative.  Negative for joint swelling.  Skin: Negative.  Negative for rash.  Neurological: Negative.  Negative for headaches.  Hematological: Bruises/bleeds easily.  Psychiatric/Behavioral: Negative.  Negative for dysphoric mood. The patient is not nervous/anxious.     Past Medical History:  Diagnosis Date  . Chicken pox   . COPD (chronic obstructive pulmonary disease) (HCC)   . Depression   . Diverticulitis   . Essential hypertension   . Heart murmur   . Hyperlipidemia   . Hypothyroidism   . Oxygen dependent   . PAD (peripheral artery disease) (HCC)   . Psoriatic arthritis (HCC)   . Sleep apnea   . Urinary incontinence      Family History  Problem Relation Age of Onset  . Hyperlipidemia Father   . Hypertension Father   . Breast cancer Maternal Grandmother   . Heart attack Maternal Grandfather   . Heart disease Maternal Uncle      Social History   Social History  . Marital status: Legally Separated    Spouse name: N/A  . Number of children: N/A  . Years of education: N/A   Occupational History  . Not on file.   Social History Main Topics  . Smoking status: Former Smoker    Packs/day: 1.00    Types: Cigarettes    Quit date: 10/12/1991  . Smokeless  tobacco: Never Used  . Alcohol use Yes     Comment:  socially  . Drug use: No  . Sexual activity: Not on file   Other Topics Concern  . 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 Son lives in [place] and works at [employer].    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 Infliximab Shortness Of Breath    Respiratory complications.  .  Vicodin [Hydrocodone-Acetaminophen] Nausea And Vomiting     Outpatient Medications Prior to Visit  Medication Sig Dispense Refill  . albuterol (PROVENTIL HFA;VENTOLIN HFA) 108 (90 Base) MCG/ACT inhaler Inhale 1-2 puffs into the lungs every 6 (six) hours as needed for wheezing or shortness of breath.    Marland Kitchen atenolol (TENORMIN) 25 MG tablet Take 1 tablet (25 mg total) by mouth at bedtime. (Patient taking differently: Take 12.5 mg by mouth 2 (two) times daily. ) 90 tablet 3  . budesonide-formoterol (SYMBICORT) 160-4.5 MCG/ACT inhaler Inhale 2 puffs into the lungs 3 (three) times daily.    . cholecalciferol (VITAMIN D) 1000 units tablet Take 1,000 Units by mouth daily.    . citalopram (CELEXA) 10 MG tablet Take 1 tablet (10 mg total) by mouth daily. 30 tablet 0  . furosemide (LASIX) 20 MG tablet Take 1 tablet (20 mg total) by mouth daily. 30 tablet 0  . guaiFENesin (MUCINEX) 600 MG 12 hr tablet Take 2 tablets (1,200 mg total) by mouth 2 (two) times daily. 60 tablet 0  . ibuprofen (ADVIL,MOTRIN) 200 MG tablet Take 200 mg by mouth every 6 (six) hours as needed for moderate pain.    Marland Kitchen ipratropium-albuterol (DUONEB) 0.5-2.5 (3) MG/3ML SOLN Take 3 mLs by nebulization 4 (four) times daily. 360 mL 0  . levothyroxine (SYNTHROID, LEVOTHROID) 150 MCG tablet Take 1 tablet by mouth daily.    . mirtazapine (REMERON) 15 MG tablet Take 1 tablet (15 mg total) by mouth at bedtime. 30 tablet 0  . Multiple Vitamin (MULTIVITAMIN) tablet Take 1 tablet by mouth daily.    Marland Kitchen oxymetazoline (AFRIN) 0.05 % nasal spray Place 1 spray into both nostrils 2 (two) times daily as needed for congestion. 30 mL 0  . pramipexole (MIRAPEX) 1.5 MG tablet Take 1.5 mg by mouth at bedtime. Take 1/2 tablet at noon and 1 tablet at bedtime    . predniSONE (DELTASONE) 10 MG tablet Take 60mg  daily for 3day,Take 50mg  daily for 3day,Take 40mg  daily for 3day,Take 30mg  daily for 3day,Take 20mg  daily for 3day,Take 10mg  daily 100 tablet 0  . sodium  chloride (OCEAN) 0.65 % SOLN nasal spray Place 1 spray into both nostrils as needed for congestion. 1 Bottle 0  . tiotropium (SPIRIVA) 18 MCG inhalation capsule Place 18 mcg into inhaler and inhale daily.     . Morphine Sulfate (MORPHINE CONCENTRATE) 10 MG/0.5ML SOLN concentrated solution Place 0.5 mLs (10 mg total) under the tongue every 4 (four) hours as needed for severe pain, anxiety or shortness of breath (dyspnea/air hunger). (Patient not taking: Reported on 11/09/2016) 30 mL 0  . polyethylene glycol (MIRALAX / GLYCOLAX) packet Take 17 g by mouth 2 (two) times daily. (Patient not taking: Reported on 11/09/2016) 14 each 0  . senna-docusate (SENOKOT-S) 8.6-50 MG tablet Take 2 tablets by mouth at bedtime. (Patient  not taking: Reported on 11/09/2016) 30 tablet 0   No facility-administered medications prior to visit.         Objective:   Physical Exam Vitals:   11/09/16 0924  BP: 128/84  Pulse: 88  SpO2: 95%  Weight: 202 lb (91.6 kg)  Height: 5\' 6"  (1.676 m)   Gen: Pleasant, overwt, in no distress,  normal affect  ENT: No lesions,  mouth clear,  oropharynx clear, no postnasal drip  Neck: No JVD, no stridor  Lungs: No use of accessory muscles, distant, wheeze only a forced expiration  Cardiovascular: RRR, heart sounds normal, no murmur or gallops, 2+ pretibial edema B  Musculoskeletal: No deformities, no cyanosis or clubbing  Neuro: alert, non focal  Skin: Warm, no lesions or rashes     Assessment & Plan:  COPD (chronic obstructive pulmonary disease) (HCC) Severe disease with significant exertional limitation, chronic hypoxemia. Appears to be recovering from her recent exacerbation. Stable BD regimen as below. I agree that she will likely benefit at some point from hospice care. I confirmed with her today that she has a firm belief that quality of life and symptom management are her priorities, more so than length of life. For now she seems to have a good support system through  home health resources, family.   Please continue your inhaled medications as you are taking them: Symbicort 2 puffs three times a day, Spiriva once a day, Albuterol 2 puffs as needed for shortness of breath.  Continue your oxygen at 4-6L/min with a goal Spo2 of 91-93%.  Follow with Dr Delton Coombes in 3 months or sooner if you have any problems.  Obstructive sleep apnea Good CPAP compliance, likes her current system. Has O2 bled in. Supplies UTD  Chronic vasomotor rhinitis She notes chronic clear drainage, happens all day long. She feels that this does impact her breathing and chest sx. She does use afrin occasionally, feels too harsh.  would like to try atrovent NS, see if she benefits from this. Use nasal saline to avoid drying out too much.   Teresa Pupa, MD, PhD 11/09/2016, 10:12 AM [facility] [phone] or if no answer [phone]

## 2016-11-09 NOTE — Assessment & Plan Note (Signed)
Severe disease with significant exertional limitation, chronic hypoxemia. Appears to be recovering from her recent exacerbation. Stable BD regimen as below. I agree that she will likely benefit at some point from hospice care. I confirmed with her today that she has a firm belief that quality of life and symptom management are her priorities, more so than length of life. For now she seems to have a good support system through home health resources, family.   Please continue your inhaled medications as you are taking them: Symbicort 2 puffs three times a day, Spiriva once a day, Albuterol 2 puffs as needed for shortness of breath.  Continue your oxygen at 4-6L/min with a goal Spo2 of 91-93%.  Follow with Dr Delton CoombesByrum in 3 months or sooner if you have any problems.

## 2016-11-09 NOTE — Telephone Encounter (Signed)
Jim notified and verbalized understanding.

## 2016-11-09 NOTE — Assessment & Plan Note (Signed)
Good CPAP compliance, likes her current system. Has O2 bled in. Supplies UTD

## 2016-11-09 NOTE — Telephone Encounter (Signed)
Teresa Weiss is calling for nursing assessment orders and needs to know what level the oxygen should be set on.  These orders have to be signed off on by Dr. Caryl NeverBurchette.  Teresa Weiss's number is [phone]

## 2016-11-09 NOTE — Telephone Encounter (Signed)
See below

## 2016-11-09 NOTE — Patient Instructions (Signed)
Please continue your inhaled medications as you are taking them: Symbicort 2 puffs three times a day, Spiriva once a day, Albuterol 2 puffs as needed for shortness of breath.  Continue your oxygen at 4-6L/min with a goal Spo2 of 91-93%.  Continue your CPAP every night Try using Atrovent nasal spray, 2 sprays each nostril up to 3x a day as needed for your nasal drainage.  Keep your nasal saline spray available to use to keep from drying your nose out too much with the new Atrovent.  Call our office for any changes in your breathing, cough, chest symptoms.  Follow with Dr Delton CoombesByrum in 3 months or sooner if you have any problems.

## 2016-11-10 ENCOUNTER — Encounter: Payer: Self-pay | Admitting: Adult Health

## 2016-11-10 ENCOUNTER — Other Ambulatory Visit: Payer: Self-pay | Admitting: Adult Health

## 2016-11-10 ENCOUNTER — Ambulatory Visit (INDEPENDENT_AMBULATORY_CARE_PROVIDER_SITE_OTHER): Payer: Medicare Other | Admitting: Adult Health

## 2016-11-10 VITALS — BP 130/86 | Temp 97.8°F | Ht 66.0 in | Wt 197.8 lb

## 2016-11-10 DIAGNOSIS — J441 Chronic obstructive pulmonary disease with (acute) exacerbation: Secondary | ICD-10-CM

## 2016-11-10 DIAGNOSIS — F329 Major depressive disorder, single episode, unspecified: Secondary | ICD-10-CM

## 2016-11-10 DIAGNOSIS — R600 Localized edema: Secondary | ICD-10-CM

## 2016-11-10 DIAGNOSIS — F32A Depression, unspecified: Secondary | ICD-10-CM

## 2016-11-10 LAB — BASIC METABOLIC PANEL
BUN: 28 mg/dL — ABNORMAL HIGH (ref 6–23)
CALCIUM: 9 mg/dL (ref 8.4–10.5)
CO2: 46 meq/L — AB (ref 19–32)
CREATININE: 0.69 mg/dL (ref 0.40–1.20)
Chloride: 90 mEq/L — ABNORMAL LOW (ref 96–112)
GFR: 86.57 mL/min (ref >60.00)
GLUCOSE: 112 mg/dL — AB (ref 70–99)
Potassium: 3.9 mEq/L (ref 3.5–5.1)
Sodium: 140 mEq/L (ref 135–145)

## 2016-11-10 MED ORDER — FUROSEMIDE 20 MG PO TABS: 20.0000 mg | ORAL_TABLET | Freq: Every day | ORAL | 1 refills | Status: DC

## 2016-11-10 MED ORDER — POTASSIUM CHLORIDE ER 10 MEQ PO TBCR: 10.0000 meq | EXTENDED_RELEASE_TABLET | Freq: Every day | ORAL | 1 refills | Status: DC

## 2016-11-10 MED ORDER — MIRTAZAPINE 15 MG PO TABS: 15.0000 mg | ORAL_TABLET | Freq: Every day | ORAL | 1 refills | Status: DC

## 2016-11-10 NOTE — Progress Notes (Signed)
Subjective:    Patient ID: Teresa Weiss, female    DOB: December 02, 1932, 83 y.o.   MRN: [id number]  HPI  83 year old female who presents to the office today  has a past medical history of Chicken pox; COPD (chronic obstructive pulmonary disease) (HCC); Depression; Diverticulitis; Essential hypertension; Heart murmur; Hyperlipidemia; Hypothyroidism; Oxygen dependent; PAD (peripheral artery disease) (HCC); Psoriatic arthritis (HCC); Sleep apnea; and Urinary incontinence. She presents to the office today for follow up after hospital admission. She was admitted on 10/21/2016 and discharged on 10/29/2016 for COPD exacerbation   She reports that she is recovering well but has not been able to get back to what she considers her baseline since being discharged from the hospital. She still continues to complain of fatigue and being somewhat weaker than what her normal is. She does report that as the days go by she feels as though she is improving.   She was started on Lasix while being admitted and feels as though this is helping with her shortness of breath as well as lower extremity edema.   She needs refills on Remeron and Lasix   Review of Systems  Constitutional: Positive for activity change and fatigue.  Respiratory: Positive for shortness of breath.   Cardiovascular: Positive for leg swelling.  Gastrointestinal: Negative.   Musculoskeletal: Positive for arthralgias.  Neurological: Negative.   Hematological: Bruises/bleeds easily.   Past Medical History:  Diagnosis Date  . Chicken pox   . COPD (chronic obstructive pulmonary disease) (HCC)   . Depression   . Diverticulitis   . Essential hypertension   . Heart murmur   . Hyperlipidemia   . Hypothyroidism   . Oxygen dependent   . PAD (peripheral artery disease) (HCC)   . Psoriatic arthritis (HCC)   . Sleep apnea   . Urinary incontinence     Social History   Social History  . Marital status: Legally Separated    Spouse  name: N/A  . Number of children: N/A  . Years of education: N/A   Occupational History  . Not on file.   Social History Main Topics  . Smoking status: Former Smoker    Packs/day: 1.00    Types: Cigarettes    Quit date: 10/12/1991  . Smokeless tobacco: Never Used  . Alcohol use Yes     Comment:  socially  . Drug use: No  . Sexual activity: Not on file   Other Topics Concern  . 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 Son lives in [place] and works at [employer].     Past Surgical History:  Procedure Laterality Date  . ABDOMINAL HYSTERECTOMY  1977  . Appendectomy    . CATARACT EXTRACTION Bilateral   . COLON RESECTION    . INGUINAL HERNIA REPAIR    . Knee replacement  Left   . LUMBAR LAMINECTOMY      Family History  Problem Relation Age of Onset  . Hyperlipidemia Father   . Hypertension Father   . Breast cancer Maternal Grandmother   . Heart attack Maternal Grandfather   . Heart disease Maternal Uncle     Allergies  Allergen Reactions  . Infliximab Shortness Of Breath    Respiratory complications.  . Vicodin [Hydrocodone-Acetaminophen] Nausea And Vomiting    Current Outpatient Prescriptions on File Prior to Visit  Medication Sig Dispense Refill  . albuterol (PROVENTIL  HFA;VENTOLIN HFA) 108 (90 Base) MCG/ACT inhaler Inhale 1-2 puffs into the lungs every 6 (six) hours as needed for wheezing or shortness of breath.    Marland Kitchen. atenolol (TENORMIN) 25 MG tablet Take 1 tablet (25 mg total) by mouth at bedtime. (Patient taking differently: Take 12.5 mg by mouth 2 (two) times daily. ) 90 tablet 3  . budesonide-formoterol (SYMBICORT) 160-4.5 MCG/ACT inhaler Inhale 2 puffs into the lungs 3 (three) times daily.    . cholecalciferol (VITAMIN D) 1000 units tablet Take 1,000 Units by mouth daily.    . citalopram (CELEXA) 10 MG tablet Take 1 tablet (10 mg total) by mouth daily. 30 tablet 0  . furosemide (LASIX) 20 MG  tablet Take 1 tablet (20 mg total) by mouth daily. 30 tablet 0  . guaiFENesin (MUCINEX) 600 MG 12 hr tablet Take 2 tablets (1,200 mg total) by mouth 2 (two) times daily. 60 tablet 0  . ibuprofen (ADVIL,MOTRIN) 200 MG tablet Take 200 mg by mouth every 6 (six) hours as needed for moderate pain.    Marland Kitchen. ipratropium (ATROVENT) 0.03 % nasal spray Place 2 sprays into both nostrils every 12 (twelve) hours. 30 mL 6  . ipratropium-albuterol (DUONEB) 0.5-2.5 (3) MG/3ML SOLN Take 3 mLs by nebulization 4 (four) times daily. 360 mL 0  . levothyroxine (SYNTHROID, LEVOTHROID) 150 MCG tablet Take 1 tablet by mouth daily.    . Morphine Sulfate (MORPHINE CONCENTRATE) 10 MG/0.5ML SOLN concentrated solution Place 0.5 mLs (10 mg total) under the tongue every 4 (four) hours as needed for severe pain, anxiety or shortness of breath (dyspnea/air hunger). 30 mL 0  . Multiple Vitamin (MULTIVITAMIN) tablet Take 1 tablet by mouth daily.    Marland Kitchen. oxymetazoline (AFRIN) 0.05 % nasal spray Place 1 spray into both nostrils 2 (two) times daily as needed for congestion. 30 mL 0  . polyethylene glycol (MIRALAX / GLYCOLAX) packet Take 17 g by mouth 2 (two) times daily. 14 each 0  . pramipexole (MIRAPEX) 1.5 MG tablet Take 1.5 mg by mouth at bedtime. Take 1/2 tablet at noon and 1 tablet at bedtime    . predniSONE (DELTASONE) 10 MG tablet Take 60mg  daily for 3day,Take 50mg  daily for 3day,Take 40mg  daily for 3day,Take 30mg  daily for 3day,Take 20mg  daily for 3day,Take 10mg  daily 100 tablet 0  . senna-docusate (SENOKOT-S) 8.6-50 MG tablet Take 2 tablets by mouth at bedtime. 30 tablet 0  . sodium chloride (OCEAN) 0.65 % SOLN nasal spray Place 1 spray into both nostrils as needed for congestion. 1 Bottle 0  . tiotropium (SPIRIVA) 18 MCG inhalation capsule Place 18 mcg into inhaler and inhale daily.      No current facility-administered medications on file prior to visit.     BP 130/86   Temp 97.8 F (36.6 C) (Oral)   Ht 5\' 6"  (1.676 m)   Wt  197 lb 12.8 oz (89.7 kg)   BMI 31.93 kg/m       Objective:   Physical Exam  Constitutional: She is oriented to person, place, and time. She appears well-developed and well-nourished. No distress.  Cardiovascular: Normal rate, regular rhythm, normal heart sounds and intact distal pulses.  Exam reveals no gallop and no friction rub.   No murmur heard. Pulmonary/Chest: Effort normal and breath sounds normal. No respiratory distress. She has no wheezes. She has no rales. She exhibits no tenderness.  Wearing home oxygen via [place]  Musculoskeletal: She exhibits edema (+ 2 pitting edema).  Walks with rolling walker  Neurological:  She is alert and oriented to person, place, and time.  Skin: Skin is warm and dry. No rash noted. She is not diaphoretic. No erythema. No pallor.  Psychiatric: She has a normal mood and affect. Her behavior is normal. Judgment and thought content normal.  Nursing note and vitals reviewed.     Assessment & Plan:  1. COPD exacerbation (HCC) - reviewed labs and imaging done during hospital admission  - Continue with Prednisone taper  - furosemide (LASIX) 20 MG tablet; Take 1 tablet (20 mg total) by mouth daily.  Dispense: 90 tablet; Refill: 1  2. Lower extremity edema - Basic metabolic panel - furosemide (LASIX) 20 MG tablet; Take 1 tablet (20 mg total) by mouth daily.  Dispense: 90 tablet; Refill: 1 - Prop legs up when not active  3. Depression, unspecified depression type  - mirtazapine (REMERON) 15 MG tablet; Take 1 tablet (15 mg total) by mouth at bedtime.  Dispense: 90 tablet; Refill: 1  Shirline Frees, NP

## 2016-11-11 ENCOUNTER — Encounter: Payer: Self-pay | Admitting: Adult Health

## 2016-11-16 ENCOUNTER — Telehealth: Payer: Self-pay | Admitting: Adult Health

## 2016-11-16 NOTE — Telephone Encounter (Signed)
° ° ° °  Rosanne AshingJim a PT with [facility]   Pt report chronic right knee pain with walking. Has instabilty on inner knee report no history of orthopedic eval.  and right knee brace  for stability. Pian level is moderate not currently taking pain meds  Can call Rosanne AshingJim back only if u have question  [phone]

## 2016-11-19 ENCOUNTER — Telehealth: Payer: Self-pay | Admitting: Adult Health

## 2016-11-19 NOTE — Telephone Encounter (Signed)
Pt had been scheduled 11/10/16.

## 2016-11-19 NOTE — Telephone Encounter (Signed)
[facility] with [facility] states pt has agreed to a palliative care consult. Arline AspCindy would like a verbal order for this to happen.

## 2016-11-23 ENCOUNTER — Encounter: Payer: Self-pay | Admitting: Vascular Surgery

## 2016-11-23 NOTE — Telephone Encounter (Signed)
Verbal order given to [facility] & she verbalized understanding.

## 2016-11-23 NOTE — Telephone Encounter (Signed)
Is this ok?

## 2016-11-23 NOTE — Telephone Encounter (Signed)
Paper script given

## 2016-11-23 NOTE — Telephone Encounter (Signed)
Jim,PT called back and stated he received the message below.  He asked that the order be sent to [facility] as they would provide this for the pt.  [facility]'s fax number is [phone].

## 2016-11-23 NOTE — Telephone Encounter (Signed)
Ok for verbal order  °

## 2016-11-23 NOTE — Telephone Encounter (Signed)
I called Rosanne AshingJim, PT and left a detailed message with the verbal order below for orthotics and knee brace.

## 2016-11-23 NOTE — Telephone Encounter (Signed)
Teresa Weiss,PT would like to have a referral for a foot orthothics and R knee brace.  Any question you may contact him at  [phone]

## 2016-11-24 NOTE — Telephone Encounter (Signed)
[facility]

## 2016-11-26 ENCOUNTER — Other Ambulatory Visit: Payer: Self-pay

## 2016-11-26 ENCOUNTER — Telehealth: Payer: Self-pay | Admitting: Adult Health

## 2016-11-26 DIAGNOSIS — F32A Depression, unspecified: Secondary | ICD-10-CM

## 2016-11-26 DIAGNOSIS — F329 Major depressive disorder, single episode, unspecified: Secondary | ICD-10-CM

## 2016-11-26 DIAGNOSIS — J441 Chronic obstructive pulmonary disease with (acute) exacerbation: Secondary | ICD-10-CM

## 2016-11-26 DIAGNOSIS — R600 Localized edema: Secondary | ICD-10-CM

## 2016-11-26 MED ORDER — MIRTAZAPINE 15 MG PO TABS: 15.0000 mg | ORAL_TABLET | Freq: Every day | ORAL | 1 refills | Status: AC

## 2016-11-26 MED ORDER — FUROSEMIDE 20 MG PO TABS: 20.0000 mg | ORAL_TABLET | Freq: Every day | ORAL | 1 refills | Status: AC

## 2016-11-26 NOTE — Telephone Encounter (Signed)
Rx has been refilled.  

## 2016-11-26 NOTE — Telephone Encounter (Signed)
Pt need new Rx for furosemide and mirtazapine    [facility]   Pt only has enough for tomorrow.

## 2016-12-01 ENCOUNTER — Telehealth: Payer: Self-pay | Admitting: Adult Health

## 2016-12-01 NOTE — Telephone Encounter (Signed)
She should follow up to be evaluated

## 2016-12-01 NOTE — Telephone Encounter (Signed)
Teresa Weiss is calling to report pt has reduce motivation in last 3 to 4. Patient is  sleeping more. Pt thinks  she is depression. Pt has no plans to hurt herself. Pt reports chronic leftside sinus drainage including the first few minutes in morning she  has blood in the drainage and then runs clear remain of day. Patient has made good progress in PT and will be discharge today. Pt has home exercise program and walks daily.

## 2016-12-01 NOTE — Telephone Encounter (Signed)
Would you mind calling to get patient scheduled to be evaluated?

## 2016-12-01 NOTE — Telephone Encounter (Signed)
Pt has been sch

## 2016-12-01 NOTE — Telephone Encounter (Signed)
[facility] See below.

## 2016-12-02 ENCOUNTER — Ambulatory Visit (INDEPENDENT_AMBULATORY_CARE_PROVIDER_SITE_OTHER): Payer: Medicare Other | Admitting: Vascular Surgery

## 2016-12-02 ENCOUNTER — Ambulatory Visit (HOSPITAL_COMMUNITY)
Admission: RE | Admit: 2016-12-02 | Discharge: 2016-12-02 | Disposition: A | Discharge status: Home / Self Care | Payer: Medicare Other | Source: Ambulatory Visit | Attending: Vascular Surgery | Admitting: Vascular Surgery

## 2016-12-02 ENCOUNTER — Encounter: Payer: Self-pay | Admitting: Vascular Surgery

## 2016-12-02 VITALS — BP 139/84 | HR 68 | Resp 18 | Ht 66.0 in | Wt 203.1 lb

## 2016-12-02 DIAGNOSIS — I83813 Varicose veins of bilateral lower extremities with pain: Secondary | ICD-10-CM | POA: Diagnosis not present

## 2016-12-02 DIAGNOSIS — I872 Venous insufficiency (chronic) (peripheral): Secondary | ICD-10-CM | POA: Diagnosis not present

## 2016-12-02 NOTE — Progress Notes (Signed)
Referring Physician:  Shirline Frees, NP  Patient name: Teresa Weiss MRN: [id number] DOB: 02-20-1933 Sex: female  REASON FOR CONSULT: Leg swelling  HPI: Christine Morton Boughton Lanum is a 83 y.o. female with several year history of swelling of both lower extremities. This has been present for about 3 years. She has tried to wear compression stockings in the past but has not been able to get these on. She has fairly significant COPD requiring home oxygen. She states she did not have the strength or the oxygen capacity to put on the stockings. She also complains of depression and interactions with her medications and has follow-up scheduled with her primary care physician early next week. She has no prior history of DVT. She did have a left saphenous vein stripping in the remote past. Other medical problems include severe COPD requiring continuous oxygen, depression, hypertension, hyperlipidemia, peripheral arterial disease all of which are currently stable.   Past Medical History:  Diagnosis Date  . Chicken pox   . COPD (chronic obstructive pulmonary disease) (HCC)   . Depression   . Diverticulitis   . Essential hypertension   . Heart murmur   . Hyperlipidemia   . Hypothyroidism   . Oxygen dependent   . PAD (peripheral artery disease) (HCC)   . Psoriatic arthritis (HCC)   . Sleep apnea   . Urinary incontinence    Past Surgical History:  Procedure Laterality Date  . ABDOMINAL HYSTERECTOMY  1977  . Appendectomy    . CATARACT EXTRACTION Bilateral   . COLON RESECTION    . INGUINAL HERNIA REPAIR    . Knee replacement  Left   . LUMBAR LAMINECTOMY    . VEIN LIGATION AND STRIPPING      Family History  Problem Relation Age of Onset  . Hyperlipidemia Father   . Hypertension Father   . Breast cancer Maternal Grandmother   . Heart attack Maternal Grandfather   . Heart disease Maternal Uncle     SOCIAL HISTORY: Social History   Social History  . Marital status:  Legally Separated    Spouse name: N/A  . Number of children: N/A  . Years of education: N/A   Occupational History  . Not on file.   Social History Main Topics  . Smoking status: Former Smoker    Packs/day: 1.00    Types: Cigarettes    Quit date: 10/12/1991  . Smokeless tobacco: Never Used  . Alcohol use Yes     Comment:  socially  . Drug use: No  . Sexual activity: Not on file   Other Topics Concern  . 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 Son lives in [place] and works at [employer].     Allergies  Allergen Reactions  . Infliximab Shortness Of Breath    Respiratory complications.  . Vicodin [Hydrocodone-Acetaminophen] Nausea And Vomiting    Current Outpatient Prescriptions  Medication Sig Dispense Refill  . albuterol (PROVENTIL HFA;VENTOLIN HFA) 108 (90 Base) MCG/ACT inhaler Inhale 1-2 puffs into the lungs every 6 (six) hours as needed for wheezing or shortness of breath.    Marland Kitchen atenolol (TENORMIN) 25 MG tablet Take 1 tablet (25 mg total) by mouth at bedtime. 90 tablet 3  . budesonide-formoterol (SYMBICORT) 160-4.5 MCG/ACT inhaler Inhale 2 puffs into the lungs 3 (three) times daily.    . cholecalciferol (VITAMIN D) 1000 units tablet  Take 1,000 Units by mouth daily.    . furosemide (LASIX) 20 MG tablet Take 1 tablet (20 mg total) by mouth daily. 90 tablet 1  . guaiFENesin (MUCINEX) 600 MG 12 hr tablet Take 2 tablets (1,200 mg total) by mouth 2 (two) times daily. 60 tablet 0  . ipratropium (ATROVENT) 0.03 % nasal spray Place 2 sprays into both nostrils every 12 (twelve) hours. 30 mL 6  . ipratropium-albuterol (DUONEB) 0.5-2.5 (3) MG/3ML SOLN Take 3 mLs by nebulization 4 (four) times daily. 360 mL 0  . levothyroxine (SYNTHROID, LEVOTHROID) 150 MCG tablet Take 1 tablet by mouth daily.    . meloxicam (MOBIC) 15 MG tablet Take 15 mg by mouth daily.    . mirtazapine (REMERON) 15 MG tablet Take 1 tablet (15 mg total)  by mouth at bedtime. 90 tablet 1  . Multiple Vitamin (MULTIVITAMIN) tablet Take 1 tablet by mouth daily.    Marland Kitchen oxymetazoline (AFRIN) 0.05 % nasal spray Place 1 spray into both nostrils 2 (two) times daily as needed for congestion. 30 mL 0  . polyethylene glycol (MIRALAX / GLYCOLAX) packet Take 17 g by mouth 2 (two) times daily. 14 each 0  . potassium chloride (K-DUR) 10 MEQ tablet Take 1 tablet (10 mEq total) by mouth daily. 90 tablet 1  . pramipexole (MIRAPEX) 1.5 MG tablet Take 1.5 mg by mouth at bedtime. Take 1/2 tablet at noon and 1 tablet at bedtime    . predniSONE (DELTASONE) 10 MG tablet Take 60mg  daily for 3day,Take 50mg  daily for 3day,Take 40mg  daily for 3day,Take 30mg  daily for 3day,Take 20mg  daily for 3day,Take 10mg  daily 100 tablet 0  . sodium chloride (OCEAN) 0.65 % SOLN nasal spray Place 1 spray into both nostrils as needed for congestion. 1 Bottle 0  . tiotropium (SPIRIVA) 18 MCG inhalation capsule Place 18 mcg into inhaler and inhale daily.     . citalopram (CELEXA) 10 MG tablet Take 1 tablet (10 mg total) by mouth daily. (Patient not taking: Reported on 12/02/2016) 30 tablet 0  . ibuprofen (ADVIL,MOTRIN) 200 MG tablet Take 200 mg by mouth every 6 (six) hours as needed for moderate pain.    Marland Kitchen Morphine Sulfate (MORPHINE CONCENTRATE) 10 MG/0.5ML SOLN concentrated solution Place 0.5 mLs (10 mg total) under the tongue every 4 (four) hours as needed for severe pain, anxiety or shortness of breath (dyspnea/air hunger). (Patient not taking: Reported on 12/02/2016) 30 mL 0  . senna-docusate (SENOKOT-S) 8.6-50 MG tablet Take 2 tablets by mouth at bedtime. (Patient not taking: Reported on 12/02/2016) 30 tablet 0   No current facility-administered medications for this visit.     ROS:   General:  No weight loss, Fever, chills  HEENT: No recent headaches, no nasal bleeding, no visual changes, no sore throat  Neurologic: No dizziness, blackouts, seizures. No recent symptoms of stroke or mini-  stroke. No recent episodes of slurred speech, or temporary blindness.  Cardiac: No recent episodes of chest pain/pressure, + shortness of breath at rest.  + shortness of breath with exertion.  Denies history of atrial fibrillation or irregular heartbeat  Vascular: No history of rest pain in feet.  No history of claudication.  No history of non-healing ulcer, No history of DVT   Pulmonary: + home oxygen, no productive cough, no hemoptysis,  + asthma or wheezing  Musculoskeletal:  [ ]  Arthritis, [ ]  Low back pain,  [ ]  Joint pain  Hematologic:No history of hypercoagulable state.  No history of easy bleeding.  No history of anemia  Gastrointestinal: No hematochezia or melena,  No gastroesophageal reflux, no trouble swallowing  Urinary: [ ]  chronic Kidney disease, [ ]  on HD - [ ]  MWF or [ ]  TTHS, [ ]  Burning with urination, [ ]  Frequent urination, [ ]  Difficulty urinating;   Skin: No rashes  Psychological: No history of anxiety,  No history of depression   Physical Examination  Vitals:   12/02/16 0954 12/02/16 0956  BP: (!) 145/89 139/84  Pulse: 68   Resp: 18   SpO2: 99%   Weight: 203 lb 1.6 oz (92.1 kg)   Height: 5\' 6"  (1.676 m)     Body mass index is 32.78 kg/m.  General:  Alert and oriented, no acute distress HEENT: Normal Neck: No bruit or JVD Pulmonary: Diffuse scattered wheezing Cardiac: Regular Rate and Rhythm without murmur Abdomen: Soft, non-tender, non-distended, no mass Skin: No rash, bluish dusky discoloration forefoot bilaterally, 1+ edema bilaterally Extremity Pulses:  2+ radial, brachial, femoral, absent dorsalis pedis, posterior tibial pulses bilaterally Musculoskeletal: No deformity or edema  Neurologic: Upper and lower extremity motor 5/5 and symmetric  DATA:  Patient had a venous duplex exam which I reviewed and interpreted. This showed some mild deep reflux on the left side left greater saphenous vein has been removed. She has no reflux in the right  leg.  ASSESSMENT:  Bilateral leg swelling. Overall her venous duplex was fairly normal. Most of her swelling is probably secondary to cardiac and pulmonary dysfunction rather than venous disease. The patient was requesting compression stockings and I had a lengthy discussion with her about options that might be available to get them on easier. I did discuss with her today that the bluish discoloration of her feet is not secondary to venous disease. I also feel that this is not due to arterial occlusive disease but a central component.   PLAN:  The patient was given a prescription today for 8-15 mm compression stockings knee-high. Hopefully these will able to accommodate her as far as application. I also discussed with her the zippered compression stockings although I do not feel that these necessarily apply much compression. She will follow-up with me on an as-needed basis.   Fabienne Brunsharles Reznor Ferrando, MD [facility] Office: [phone] Pager: [phone]

## 2016-12-03 ENCOUNTER — Telehealth: Payer: Self-pay

## 2016-12-03 NOTE — Telephone Encounter (Signed)
I received a phone call from [facility]osp[facility] stating that they faxed over a COPD Pneumonia Home health medication protocol - they state that if a patient needs an antibiotic, they need to know which one can be given. The choices are Levaquin, Doxycycline, and Zithromax. They need to know which antibiotic would be best for patient. Please advise.

## 2016-12-06 NOTE — Telephone Encounter (Signed)
I am ok with using Doxycycline

## 2016-12-07 ENCOUNTER — Ambulatory Visit (INDEPENDENT_AMBULATORY_CARE_PROVIDER_SITE_OTHER): Payer: Medicare Other | Admitting: Adult Health

## 2016-12-07 ENCOUNTER — Telehealth: Payer: Self-pay | Admitting: Adult Health

## 2016-12-07 ENCOUNTER — Encounter: Payer: Self-pay | Admitting: Adult Health

## 2016-12-07 VITALS — BP 160/72 | Temp 98.9°F | Ht 66.0 in | Wt 207.2 lb

## 2016-12-07 DIAGNOSIS — Z79899 Other long term (current) drug therapy: Secondary | ICD-10-CM

## 2016-12-07 NOTE — Progress Notes (Signed)
Subjective:    Patient ID: Teresa Weiss, female    DOB: Nov 28, 1932, 82 y.o.   MRN: [id number]  HPI  83 year old female who  has a past medical history of Chicken pox; COPD (chronic obstructive pulmonary disease) (HCC); Depression; Diverticulitis; Essential hypertension; Heart murmur; Hyperlipidemia; Hypothyroidism; Oxygen dependent; PAD (peripheral artery disease) (HCC); Psoriatic arthritis (HCC); Sleep apnea; and Urinary incontinence.   She presents to the office today for medication follow up. She reports that she feels as though she is on too much medications. Her biggest complaint is that of frequent urination from Lasix and " not being able to live my life and do things because I have to do nebulizers four times a day.   She reports feeling well and has no other acute complaints.    Review of Systems  Constitutional: Positive for activity change.  HENT: Positive for postnasal drip and rhinorrhea.   Respiratory: Positive for shortness of breath.   Cardiovascular: Positive for leg swelling.  Musculoskeletal: Positive for arthralgias.  Neurological: Negative.   All other systems reviewed and are negative.  Past Medical History:  Diagnosis Date  . Chicken pox   . COPD (chronic obstructive pulmonary disease) (HCC)   . Depression   . Diverticulitis   . Essential hypertension   . Heart murmur   . Hyperlipidemia   . Hypothyroidism   . Oxygen dependent   . PAD (peripheral artery disease) (HCC)   . Psoriatic arthritis (HCC)   . Sleep apnea   . Urinary incontinence     Social History   Social History  . Marital status: Legally Separated    Spouse name: N/A  . Number of children: N/A  . Years of education: N/A   Occupational History  . Not on file.   Social History Main Topics  . Smoking status: Former Smoker    Packs/day: 1.00    Types: Cigarettes    Quit date: 10/12/1991  . Smokeless tobacco: Never Used  . Alcohol use Yes     Comment:  socially  .  Drug use: No  . Sexual activity: Not on file   Other Topics Concern  . 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 Son lives in [place] and works at [employer].     Past Surgical History:  Procedure Laterality Date  . ABDOMINAL HYSTERECTOMY  1977  . Appendectomy    . CATARACT EXTRACTION Bilateral   . COLON RESECTION    . INGUINAL HERNIA REPAIR    . Knee replacement  Left   . LUMBAR LAMINECTOMY    . VEIN LIGATION AND STRIPPING      Family History  Problem Relation Age of Onset  . Hyperlipidemia Father   . Hypertension Father   . Breast cancer Maternal Grandmother   . Heart attack Maternal Grandfather   . Heart disease Maternal Uncle     Allergies  Allergen Reactions  . Infliximab Shortness Of Breath    Respiratory complications.  . Vicodin [Hydrocodone-Acetaminophen] Nausea And Vomiting    Current Outpatient Prescriptions on File Prior to Visit  Medication Sig Dispense Refill  . albuterol (PROVENTIL HFA;VENTOLIN HFA) 108 (90 Base) MCG/ACT inhaler Inhale 1-2 puffs into the lungs every 6 (six) hours as needed for wheezing or shortness of breath.    Marland Kitchen atenolol (TENORMIN) 25 MG tablet Take 1 tablet (25 mg total) by mouth at  bedtime. 90 tablet 3  . budesonide-formoterol (SYMBICORT) 160-4.5 MCG/ACT inhaler Inhale 2 puffs into the lungs 3 (three) times daily.    . cholecalciferol (VITAMIN D) 1000 units tablet Take 1,000 Units by mouth daily.    . furosemide (LASIX) 20 MG tablet Take 1 tablet (20 mg total) by mouth daily. 90 tablet 1  . guaiFENesin (MUCINEX) 600 MG 12 hr tablet Take 2 tablets (1,200 mg total) by mouth 2 (two) times daily. 60 tablet 0  . ibuprofen (ADVIL,MOTRIN) 200 MG tablet Take 200 mg by mouth every 6 (six) hours as needed for moderate pain.    Marland Kitchen. ipratropium (ATROVENT) 0.03 % nasal spray Place 2 sprays into both nostrils every 12 (twelve) hours. 30 mL 6  . ipratropium-albuterol (DUONEB) 0.5-2.5 (3)  MG/3ML SOLN Take 3 mLs by nebulization 4 (four) times daily. 360 mL 0  . levothyroxine (SYNTHROID, LEVOTHROID) 150 MCG tablet Take 1 tablet by mouth daily.    . meloxicam (MOBIC) 15 MG tablet Take 15 mg by mouth daily.    . mirtazapine (REMERON) 15 MG tablet Take 1 tablet (15 mg total) by mouth at bedtime. 90 tablet 1  . Morphine Sulfate (MORPHINE CONCENTRATE) 10 MG/0.5ML SOLN concentrated solution Place 0.5 mLs (10 mg total) under the tongue every 4 (four) hours as needed for severe pain, anxiety or shortness of breath (dyspnea/air hunger). 30 mL 0  . Multiple Vitamin (MULTIVITAMIN) tablet Take 1 tablet by mouth daily.    . polyethylene glycol (MIRALAX / GLYCOLAX) packet Take 17 g by mouth 2 (two) times daily. 14 each 0  . potassium chloride (K-DUR) 10 MEQ tablet Take 1 tablet (10 mEq total) by mouth daily. 90 tablet 1  . pramipexole (MIRAPEX) 1.5 MG tablet Take 1.5 mg by mouth at bedtime. Take 1/2 tablet at noon and 1 tablet at bedtime    . predniSONE (DELTASONE) 10 MG tablet Take 60mg  daily for 3day,Take 50mg  daily for 3day,Take 40mg  daily for 3day,Take 30mg  daily for 3day,Take 20mg  daily for 3day,Take 10mg  daily 100 tablet 0  . senna-docusate (SENOKOT-S) 8.6-50 MG tablet Take 2 tablets by mouth at bedtime. 30 tablet 0  . sodium chloride (OCEAN) 0.65 % SOLN nasal spray Place 1 spray into both nostrils as needed for congestion. 1 Bottle 0  . tiotropium (SPIRIVA) 18 MCG inhalation capsule Place 18 mcg into inhaler and inhale daily.      No current facility-administered medications on file prior to visit.     BP (!) 160/72   Temp 98.9 F (37.2 C) (Oral)   Ht 5\' 6"  (1.676 m)   Wt 207 lb 3.2 oz (94 kg)   BMI 33.44 kg/m       Objective:   Physical Exam  Constitutional: She is oriented to person, place, and time. She appears well-developed and well-nourished. No distress.  Cardiovascular: Normal rate, regular rhythm, normal heart sounds and intact distal pulses.  Exam reveals no gallop and  no friction rub.   No murmur heard. Pulmonary/Chest: Breath sounds normal. No respiratory distress. She has no wheezes. She has no rales. She exhibits no tenderness.  Oxygen via [place]   Musculoskeletal: She exhibits edema. She exhibits no tenderness or deformity.  Using a rolling walker  Neurological: She is alert and oriented to person, place, and time.  Skin: Skin is warm and dry. No rash noted. She is not diaphoretic. No erythema. No pallor.  Psychiatric: She has a normal mood and affect. Her behavior is normal. Judgment and thought content normal.  Vitals reviewed.      Assessment & Plan:  1. Medication management - We reviewed mediations in detail  - I am ok with her taking lasix and potassium every other day .  - She is going to do her nebulizers twice a day and see how she does. I advised to follow up with Pulmonary if she needs  - Follow up with this office for any acute complaint.   Shirline Frees, NP

## 2016-12-07 NOTE — Telephone Encounter (Signed)
Skill nursing has discharged and there are no other skill nursing needs. The Patient is doing very well and Independent with mediation management and her COPD management. She is with palliative care as a resource.

## 2016-12-07 NOTE — Telephone Encounter (Signed)
I will route to Cory as FYI.  

## 2017-01-31 ENCOUNTER — Telehealth: Payer: Self-pay | Admitting: Emergency Medicine

## 2017-01-31 NOTE — Telephone Encounter (Signed)
Spoke with patient and informed her to bring CPAP machine with cord for appointment tomorrow with RB. Pt verbalized understanding and did not have any questions. Nothing further is needed.

## 2017-02-01 ENCOUNTER — Telehealth: Payer: Self-pay | Admitting: Emergency Medicine

## 2017-02-01 ENCOUNTER — Ambulatory Visit (INDEPENDENT_AMBULATORY_CARE_PROVIDER_SITE_OTHER): Payer: Medicare Other | Admitting: Emergency Medicine

## 2017-02-01 ENCOUNTER — Encounter: Payer: Self-pay | Admitting: Emergency Medicine

## 2017-02-01 DIAGNOSIS — J300 Vasomotor rhinitis: Secondary | ICD-10-CM | POA: Diagnosis not present

## 2017-02-01 DIAGNOSIS — G4733 Obstructive sleep apnea (adult) (pediatric): Secondary | ICD-10-CM | POA: Diagnosis not present

## 2017-02-01 DIAGNOSIS — J9611 Chronic respiratory failure with hypoxia: Secondary | ICD-10-CM

## 2017-02-01 DIAGNOSIS — J449 Chronic obstructive pulmonary disease, unspecified: Secondary | ICD-10-CM

## 2017-02-01 MED ORDER — AZELASTINE HCL 0.1 % NA SOLN: 2.0000 | Freq: Two times a day (BID) | NASAL | 3 refills | Status: DC

## 2017-02-01 MED ORDER — FLUTICASONE PROPIONATE 50 MCG/ACT NA SUSP: 2.0000 | Freq: Every day | NASAL | 3 refills | Status: AC

## 2017-02-01 NOTE — Progress Notes (Signed)
Subjective:    Patient ID: Teresa Weiss, female    DOB: 1933/07/25, 83 y.o.   MRN: [id number]  HPI 83 year old woman, former smoker (~45 pack years) with hx HTN, hypothyroidism, psoriatic arthritis, OSA on CPAP. She was dx with COPD several years ago, has been on home O2 since early 2000's, 4-6L/min. Her son is R. Lundy with [employer]. She was recently admitted 1/11-1/19/18 for an AE of her COPD. She was treated with steroids, BD's, brief BiPAP. Benefited from morphine for sx of dyspnea after being evaluated by Dr Phillips Odor. Hospice care at some point in the future was discussed. Her code status was defined as DNR / DNI at that time. Based on her dscription it sounds like she has Home health but does not have Hospice. She presents today to establish care. Has been managed on Symbicort TID and Spiriva. She uses albuterol about 3-4x a day. She has had 2 exacerbations in the last year, but prior to that had been exacerbating much less frequently.   She believes that she is recovering but has not gotten back to her baseline since hospitalization. She is still tired, a bit weaker than her usual. Intermittently has some dyspnea - was SOB this am while getting ready for this visit. She isn't coughing, has minimal wheeze. She has a lot of clear nasal drainage, all day long. Has Afrin on her list for prn. She has been on flonase before without much effect.   Good compliance with her o2 and her CPAP.    CXR 10/21/16 some cardiac enlargement, emphysema, no evidence ILD.   ROV 02/01/17 -- This follow-up visit for severe COPD and obstructive sleep apnea. Also with chronic rhinitis. We started Atrovent nasal spray at her last visit to see if this would help - she has not benefited. She is out of flonase. She is currently managed on Symbicort, Spiriva. She has good compliance with her CPAP: 94% usage, 77% usage greater than 4 hours a night, good correction of her AHI. Some occasional leak. CPAP 12  cmH2O. She feels significant clinical benefit. No flares since last time. Uses albuterol very rarely.   Review of Systems  Constitutional: Negative.  Negative for fever and unexpected weight change.  HENT: Positive for postnasal drip and rhinorrhea. Negative for congestion, dental problem, ear pain, nosebleeds, sinus pressure, sneezing, sore throat and trouble swallowing.   Eyes: Negative.  Negative for redness and itching.  Respiratory: Positive for shortness of breath. Negative for cough, chest tightness and wheezing.   Cardiovascular: Positive for leg swelling. Negative for palpitations.  Gastrointestinal: Negative.  Negative for nausea and vomiting.  Genitourinary: Negative.  Negative for dysuria.  Musculoskeletal: Negative.  Negative for joint swelling.  Skin: Negative.  Negative for rash.  Neurological: Negative.  Negative for headaches.  Hematological: Bruises/bleeds easily.  Psychiatric/Behavioral: Negative.  Negative for dysphoric mood. The patient is not nervous/anxious.     Past Medical History:  Diagnosis Date  . Chicken pox   . COPD (chronic obstructive pulmonary disease) (HCC)   . Depression   . Diverticulitis   . Essential hypertension   . Heart murmur   . Hyperlipidemia   . Hypothyroidism   . Oxygen dependent   . PAD (peripheral artery disease) (HCC)   . Psoriatic arthritis (HCC)   . Sleep apnea   . Urinary incontinence      Family History  Problem Relation Age of Onset  . Hyperlipidemia Father   . Hypertension Father   . Breast  cancer Maternal Grandmother   . Heart attack Maternal Grandfather   . Heart disease Maternal Uncle      Social History   Social History  . Marital status: Legally Separated    Spouse name: N/A  . Number of children: N/A  . Years of education: N/A   Occupational History  . Not on file.   Social History Main Topics  . Smoking status: Former Smoker    Packs/day: 1.00    Types: Cigarettes    Quit date: 10/12/1991  .  Smokeless tobacco: Never Used  . Alcohol use Yes     Comment:  socially  . Drug use: No  . Sexual activity: Not on file   Other Topics Concern  . 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 Son lives in [place] and works at [employer].    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 Infliximab Shortness Of Breath    Respiratory complications.  . Vicodin [Hydrocodone-Acetaminophen] Nausea And Vomiting     Outpatient Medications Prior to Visit  Medication Sig Dispense Refill  . albuterol (PROVENTIL HFA;VENTOLIN HFA) 108 (90 Base) MCG/ACT inhaler Inhale 1-2 puffs into the lungs every 6 (six) hours as needed for wheezing or shortness of breath.    Marland Kitchen atenolol (TENORMIN) 25 MG tablet Take 1 tablet (25 mg total) by mouth at bedtime. 90 tablet 3  . budesonide-formoterol (SYMBICORT) 160-4.5 MCG/ACT inhaler Inhale 2 puffs into the lungs 3 (three) times daily.    . cholecalciferol (VITAMIN D) 1000 units tablet Take 1,000 Units by mouth daily.    . furosemide (LASIX) 20 MG tablet Take 1 tablet (20 mg total) by mouth daily. 90 tablet 1  . guaiFENesin (MUCINEX) 600 MG 12 hr tablet Take 2 tablets (1,200 mg total) by mouth 2 (two) times daily. 60 tablet 0  . ibuprofen (ADVIL,MOTRIN) 200 MG tablet Take 200 mg by mouth every 6 (six) hours as needed for moderate pain.    Marland Kitchen ipratropium (ATROVENT) 0.03 % nasal spray Place 2 sprays into both nostrils every 12 (twelve) hours. 30 mL 6  . ipratropium-albuterol (DUONEB) 0.5-2.5 (3) MG/3ML SOLN Take 3 mLs by nebulization 4 (four) times daily. 360 mL 0  . levothyroxine (SYNTHROID, LEVOTHROID) 150 MCG tablet Take 1 tablet by mouth daily.    . meloxicam (MOBIC) 15 MG tablet Take 15 mg by mouth daily.    . mirtazapine (REMERON) 15 MG tablet Take 1 tablet (15 mg total) by mouth at bedtime. 90  tablet 1  . Morphine Sulfate (MORPHINE CONCENTRATE) 10 MG/0.5ML SOLN concentrated solution Place 0.5 mLs (10 mg total) under the tongue every 4 (four) hours as needed for severe pain, anxiety or shortness of breath (dyspnea/air hunger). 30 mL 0  . Multiple Vitamin (MULTIVITAMIN) tablet Take 1 tablet by mouth daily.    . polyethylene glycol (MIRALAX / GLYCOLAX) packet Take 17 g by mouth 2 (two) times daily. 14 each 0  . potassium chloride (K-DUR) 10 MEQ tablet Take 1 tablet (10 mEq total) by mouth daily. 90 tablet 1  . pramipexole (MIRAPEX) 1.5 MG tablet Take 1.5 mg by mouth at bedtime. Take 1/2 tablet at noon and 1 tablet at bedtime    . senna-docusate (SENOKOT-S) 8.6-50 MG tablet Take 2 tablets by  mouth at bedtime. 30 tablet 0  . sodium chloride (OCEAN) 0.65 % SOLN nasal spray Place 1 spray into both nostrils as needed for congestion. 1 Bottle 0  . tiotropium (SPIRIVA) 18 MCG inhalation capsule Place 18 mcg into inhaler and inhale daily.     . predniSONE (DELTASONE) 10 MG tablet Take  daily for 3day,Take  daily for 3day,Take  daily for 3day,Take  daily for 3day,Take  daily for 3day,Take  daily 100 tablet 0   No facility-administered medications prior to visit.         Objective:   Physical Exam Vitals:   02/01/17 1105 02/01/17 1107  BP: 126/78   Pulse: 69   SpO2: 97% 97%  Weight: 206 lb (93.4 kg)    Gen: Pleasant, overwt, in no distress,  normal affect  ENT: No lesions,  mouth clear,  oropharynx clear, no postnasal drip  Neck: No JVD, no stridor  Lungs: No use of accessory muscles, distant, wheeze only a forced expiration  Cardiovascular: RRR, heart sounds normal, no murmur or gallops, 1+ pretibial edema B  Musculoskeletal: No deformities, no cyanosis or clubbing  Neuro: alert, non focal  Skin: Warm, no lesions or rashes     Assessment & Plan:  Chronic vasomotor rhinitis No benefit from atrovent. Restart flonase and try astelin to see if she  benefits.   COPD (chronic obstructive pulmonary disease) (HCC) contineu spiriva, symbicort, albiterol prn  Obstructive sleep apnea Good compliance with CPAP 12, good clinical benefit. She needs a new DME company due to insurance. Will make a new referral - need to find her original PSG if possible, was done thru [facility].   Chronic hypoxemic respiratory failure (HCC) Continue her supplemental O2, 3-6L/min depending on activity level.   Levy Pupa, MD, PhD 02/01/2017, 11:48 AM [facility] [phone] or if no answer [phone]

## 2017-02-01 NOTE — Patient Instructions (Addendum)
Stop Atrovent nasal spray.  Restart flonase nasal spray, 2 sprays each nostril daily Try starting astelin nasal spray, 2 sprays each side 2-3x a day.  Please continue the Symbicort and Spiriva Take albuterol 2 puffs up to every 4 hours if needed for shortness of breath.  Continue your oxygen at 3-6L/min depending on your activity level.  We will work on [employer] DME company for your CPAP machine.

## 2017-02-01 NOTE — Telephone Encounter (Signed)
lmomtcb x 1 for the pt 

## 2017-02-01 NOTE — Assessment & Plan Note (Signed)
Good compliance with CPAP 12, good clinical benefit. She needs a new DME company due to insurance. Will make a new referral - need to find her original PSG if possible, was done thru [facility].

## 2017-02-01 NOTE — Assessment & Plan Note (Signed)
contineu spiriva, symbicort, albiterol prn

## 2017-02-01 NOTE — Telephone Encounter (Signed)
Says ins. Won't cover under [facility], but will under [employer] # [phone] and under [employer] # [phone] there are others, but says these wher insurance first choices.Caren Griffins

## 2017-02-01 NOTE — Assessment & Plan Note (Signed)
Continue her supplemental O2, 3-6L/min depending on activity level.

## 2017-02-01 NOTE — Assessment & Plan Note (Signed)
No benefit from atrovent. Restart flonase and try astelin to see if she benefits.

## 2017-02-03 NOTE — Telephone Encounter (Signed)
Called and spoke to pt. Pt states she has tried to get the sleep study faxed to our office but our fax machines have not been working [employer] with [employer] in [place] at [phone] and she states she will have this re-faxed to our office. Advised her we will call her back if the fax machines are still down and to let her know when they are up and running so she can fax them.   Will await fax.

## 2017-02-07 ENCOUNTER — Other Ambulatory Visit: Payer: Self-pay | Admitting: Adult Health

## 2017-02-07 NOTE — Telephone Encounter (Signed)
Spoke with Selena Batten at [facility], is refaxing sleep study as our fax machine is now working.  Will await fax.

## 2017-02-08 NOTE — Telephone Encounter (Signed)
Yes in RB's look at folder.

## 2017-02-08 NOTE — Telephone Encounter (Signed)
Delaney Meigs, have you received this pt's sleep study

## 2017-02-09 NOTE — Telephone Encounter (Signed)
Will forward to RB to follow up on

## 2017-02-10 NOTE — Telephone Encounter (Signed)
I have her PSG from [facility]. She needs an order for a new DME for CPAP 12 cm h2O. Will need to send the sleep study with the orders.

## 2017-02-14 NOTE — Telephone Encounter (Signed)
Ok order for CPAP placed.

## 2017-02-21 ENCOUNTER — Other Ambulatory Visit: Payer: Self-pay | Admitting: Adult Health

## 2017-02-21 DIAGNOSIS — F329 Major depressive disorder, single episode, unspecified: Secondary | ICD-10-CM

## 2017-02-21 DIAGNOSIS — R600 Localized edema: Secondary | ICD-10-CM

## 2017-02-21 DIAGNOSIS — J441 Chronic obstructive pulmonary disease with (acute) exacerbation: Secondary | ICD-10-CM

## 2017-02-21 DIAGNOSIS — F32A Depression, unspecified: Secondary | ICD-10-CM

## 2017-03-16 ENCOUNTER — Telehealth: Payer: Self-pay | Admitting: Adult Health

## 2017-03-16 MED ORDER — PRAMIPEXOLE DIHYDROCHLORIDE 1.5 MG PO TABS: 1.5000 mg | ORAL_TABLET | Freq: Every day | ORAL | 0 refills | Status: AC

## 2017-03-16 NOTE — Telephone Encounter (Signed)
° °  Pt said she normally gets it through the [facility] and has not received it and is asking if it can be refilled to the below pharmacy.  Pt thinks she may have received it and threw it away by mistake     Pt request refill of the following:  pramipexole (MIRAPEX) 1.5 MG tablet   [facility]:  [facility]  [address]

## 2017-03-16 NOTE — Telephone Encounter (Signed)
rx sent

## 2017-03-17 ENCOUNTER — Encounter: Payer: Self-pay | Admitting: Adult Health

## 2017-03-17 ENCOUNTER — Other Ambulatory Visit: Payer: Self-pay | Admitting: Adult Health

## 2017-03-17 NOTE — Telephone Encounter (Signed)
Denied.  Duplicate request.  Filled on 03/16/17.

## 2017-03-20 ENCOUNTER — Encounter: Payer: Self-pay | Admitting: Adult Health

## 2017-03-22 ENCOUNTER — Other Ambulatory Visit: Payer: Self-pay | Admitting: Adult Health

## 2017-03-22 DIAGNOSIS — G8929 Other chronic pain: Secondary | ICD-10-CM

## 2017-03-22 DIAGNOSIS — M25569 Pain in unspecified knee: Principal | ICD-10-CM

## 2017-03-24 ENCOUNTER — Ambulatory Visit (INDEPENDENT_AMBULATORY_CARE_PROVIDER_SITE_OTHER): Payer: Medicare Other | Admitting: Sports Medicine

## 2017-03-24 ENCOUNTER — Ambulatory Visit (INDEPENDENT_AMBULATORY_CARE_PROVIDER_SITE_OTHER): Payer: Medicare Other

## 2017-03-24 ENCOUNTER — Other Ambulatory Visit: Payer: Self-pay | Admitting: Sports Medicine

## 2017-03-24 ENCOUNTER — Ambulatory Visit: Payer: Self-pay

## 2017-03-24 ENCOUNTER — Encounter: Payer: Self-pay | Admitting: Sports Medicine

## 2017-03-24 DIAGNOSIS — G8929 Other chronic pain: Secondary | ICD-10-CM

## 2017-03-24 DIAGNOSIS — M25561 Pain in right knee: Secondary | ICD-10-CM

## 2017-03-24 DIAGNOSIS — M1711 Unilateral primary osteoarthritis, right knee: Secondary | ICD-10-CM | POA: Diagnosis not present

## 2017-03-24 NOTE — Assessment & Plan Note (Signed)
Marked degenerative change.  She is not interested in surgery.  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  Injection performed today with aspiration revealing 27 cc of orange tinged thick synovial fluid.  This will be sent for crystal analysis for possible pseudogout versus monosodium urate crystal involvement. DonJoy reaction brace provided today.  Follow-up 3 months for consideration of Visco supplementation.  We will go ahead and get her approved for the Orthovisc series given the effusion she has today.  ++++++++++++++++++++++++++++++++++++++++++++ PROCEDURE NOTE -  ULTRASOUND GUIDEDINJECTION: Right Knee Aspiration & Injection Images were obtained and interpreted by myself, Gaspar BiddingMichael Tamikia Chowning, DO  Images have been saved and stored to PACS system. Images obtained on: GE S7 Ultrasound machine  ULTRASOUND FINDINGS:  Significant tricompartmental degenerative changes.  Moderate effusion.  DESCRIPTION OF PROCEDURE:  The patient's clinical condition is marked by substantial pain and/or significant functional disability. Other conservative therapy has not provided relief, is contraindicated, or not appropriate. There is a reasonable likelihood that injection will significantly improve the patient's pain and/or functional impairment. After discussing the risks, benefits and expected outcomes of the injection and all questions were reviewed and answered, the patient wished to undergo the above named procedure. Verbal consent was obtained. The ultrasound was used to identify the target structure and adjacent neurovascular structures. The skin was then prepped in sterile fashion and the target structure was injected under direct visualization using sterile technique as below: PREP: Alcohol, Ethel Chloride APPROACH: Superior lateral, sterile stopcock Technique, 21 g 2 " needle INJECTATE: 3 cc 1% lidocaine, 2 cc 0.5% marcaine, 2cc 40mg  DepoMedrol ASPIRATE: 27 cc of  thick orange tinged synovial fluid DRESSING: Band-Aid and a DonJoy reaction brace  Post procedural instructions including recommending icing and warning signs for infection were reviewed. This procedure was well tolerated and there were no complications.   IMPRESSION: Succesful US Guided Aspiration & injection

## 2017-03-24 NOTE — Progress Notes (Signed)
OFFICE VISIT NOTE Teresa Weiss. Teresa Weiss [facility] [phone]  Teresa Weiss Teresa Weiss - 83 y.o. female MRN [id number]  Date of birth: 01/28/1933  Visit Date: 03/24/2017  PCP: Shirline Frees, NP   Referred by: Shirline Frees, NP  Clovis Cao, cma acting as scribe for Dr. Berline Chough.  SUBJECTIVE:   Chief Complaint  Patient presents with  . New Patient (Initial Visit)  . Right Knee Pain   HPI: As below and per problem based documentation when appropriate.  Teresa Weiss reports chronic pain in her RT knee for approx 1 year now. Hx of left TKR. No injury. No prior imaging. Location is the medial aspect of knee. Takes Meloxicam 15mg  daily for arthritis. No heat, ice or other treatment therapies. No radiation pain. Pain is relieved at rest. Sx trigger when talking and going from a sitting to standing position.    Review of Systems  Constitutional: Negative for chills, fever, malaise/fatigue and weight loss.  HENT: Negative.   Eyes: Negative.   Respiratory: Negative.   Cardiovascular: Negative for chest pain, palpitations and orthopnea.  Gastrointestinal: Negative.   Genitourinary: Negative.   Musculoskeletal: Positive for joint pain.  Skin: Negative.   Neurological: Negative for dizziness, tingling, tremors and headaches.  Endo/Heme/Allergies: Negative for environmental allergies and polydipsia. Does not bruise/bleed easily.  Psychiatric/Behavioral: Negative.     Otherwise per HPI.  HISTORY & PERTINENT PRIOR DATA:  No specialty comments available. She reports that she quit smoking about 25 years ago. Her smoking use included Cigarettes. She smoked 1.00 pack per day. She has never used smokeless tobacco. No results for input(s): HGBA1C, LABURIC in the last 8760 hours. Medications & Allergies reviewed per EMR Patient Active Problem List   Diagnosis Date Noted  . Primary osteoarthritis of right knee 03/24/2017  . Obstructive  sleep apnea 11/09/2016  . Chronic vasomotor rhinitis 11/09/2016  . Hyperlipidemia   . Anxiety state   . Pain   . Palliative care by specialist   . Goals of care, counseling/discussion   . Encounter for hospice care discussion   . Chronic hypoxemic respiratory failure (HCC) 10/21/2016  . Chest tightness 10/21/2016  . Essential hypertension 10/21/2016  . Hypothyroidism 10/21/2016  . Depression 10/21/2016  . RLS (restless legs syndrome) 10/21/2016  . COPD (chronic obstructive pulmonary disease) (HCC) 10/13/2016   Past Medical History:  Diagnosis Date  . Chicken pox   . COPD (chronic obstructive pulmonary disease) (HCC)   . Depression   . Diverticulitis   . Essential hypertension   . Heart murmur   . Hyperlipidemia   . Hypothyroidism   . Oxygen dependent   . PAD (peripheral artery disease) (HCC)   . Psoriatic arthritis (HCC)   . Sleep apnea   . Urinary incontinence    Family History  Problem Relation Age of Onset  . Hyperlipidemia Father   . Hypertension Father   . Breast cancer Maternal Grandmother   . Heart attack Maternal Grandfather   . Heart disease Maternal Uncle    Past Surgical History:  Procedure Laterality Date  . ABDOMINAL HYSTERECTOMY  1977  . Appendectomy    . CATARACT EXTRACTION Bilateral   . COLON RESECTION    . INGUINAL HERNIA REPAIR    . Knee replacement  Left   . LUMBAR LAMINECTOMY    . VEIN LIGATION AND STRIPPING     Social History   Occupational History  . Not on file.   Social History  Main Topics  . Smoking status: Former Smoker    Packs/day: 1.00    Types: Cigarettes    Quit date: 10/12/1991  . Smokeless tobacco: Never Used  . Alcohol use Yes     Comment:  socially  . Drug use: No  . Sexual activity: Not on file    OBJECTIVE:  VS:  HT:    WT:209 lb (94.8 kg)  BMI:     BP:(!) 140/100  HR:(!) 102bpm  TEMP: ( )  RESP:99 % EXAM: Findings:  WDWN, NAD, Non-toxic appearing Alert & appropriately interactive Not depressed or  anxious appearing No increased work of breathing. Pupils are equal. EOM intact without nystagmus No clubbing or cyanosis of the extremities appreciated No significant rashes/lesions/ulcerations overlying the examined area. DP & PT pulses 2+/4.  No significant pretibial edema. Sensation intact to light touch in lower extremities.  Right Knee: Significant valgus deformity.  She has marked pain across the medial and lateral joint lines.  She has 5-6 mm opening with varus testing..   Moderate effusion.   ROM: 0 to 110.   Extensor mechanism intact.   Pain with McMurray's testing.  X-rays reviewed that show significant lateral joint line collapse with valgus deformity.  Lateral compartment is bone-on-bone.      Dg Knee 1-2 Views Right  Result Date: 03/24/2017 CLINICAL DATA:  Chronic right knee pain, no injury EXAM: RIGHT KNEE - 1-2 VIEW COMPARISON:  None. FINDINGS: There is significant valgus deformity of the right knee with resultant complete loss of lateral compartment joint space and sclerosis with spurring. Chondrocalcinosis also is present which may indicate CPPD arthropathy. There may be a small amount of right knee joint fluid present. The patellofemoral space of the right knee is within normal limits. Components of left total knee replacement are noted and appear to be in good position with no complicating features. IMPRESSION: 1. Significant valgus deformity of the right knee with complete loss of joint space involving the lateral compartment. 2. Chondrocalcinosis. 3. Tiny amount of right knee joint fluid. Electronically Signed   By: Dwyane Dee M.D.   On: 03/24/2017 10:02   ASSESSMENT & PLAN:   Problem List Items Addressed This Visit    Primary osteoarthritis of right knee - Primary    Marked degenerative change.  She is not interested in surgery.  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  Injection performed today with aspiration revealing 27  cc of orange tinged thick synovial fluid.  This will be sent for crystal analysis for possible pseudogout versus monosodium urate crystal involvement. DonJoy reaction brace provided today.  Follow-up 3 months for consideration of Visco supplementation.  We will go ahead and get her approved for the Orthovisc series given the effusion she has today.  ++++++++++++++++++++++++++++++++++++++++++++ PROCEDURE NOTE -  ULTRASOUND GUIDEDINJECTION: Right Knee Aspiration & Injection Images were obtained and interpreted by myself, Gaspar Bidding, DO  Images have been saved and stored to PACS system. Images obtained on: GE S7 Ultrasound machine  ULTRASOUND FINDINGS:  Significant tricompartmental degenerative changes.  Moderate effusion.  DESCRIPTION OF PROCEDURE:  The patient's clinical condition is marked by substantial pain and/or significant functional disability. Other conservative therapy has not provided relief, is contraindicated, or not appropriate. There is a reasonable likelihood that injection will significantly improve the patient's pain and/or functional impairment. After discussing the risks, benefits and expected outcomes of the injection and all questions were reviewed and answered, the patient wished to undergo the above  named procedure. Verbal consent was obtained. The ultrasound was used to identify the target structure and adjacent neurovascular structures. The skin was then prepped in sterile fashion and the target structure was injected under direct visualization using sterile technique as below: PREP: Alcohol, Ethel Chloride APPROACH: Superior lateral, sterile stopcock Technique, 21 g 2 " needle INJECTATE: 3 cc 1% lidocaine, 2 cc 0.5% marcaine, 2cc 40mg  DepoMedrol ASPIRATE: 27 cc of thick orange tinged synovial fluid DRESSING: Band-Aid and a DonJoy reaction brace  Post procedural instructions including recommending icing and warning signs for infection were reviewed. This  procedure was well tolerated and there were no complications.   IMPRESSION: Succesful US Guided Aspiration & injection           Other Visit Diagnoses    Chronic pain of right knee       Relevant Orders   DG Knee 1-2 Views Right (Completed)   US GUIDED NEEDLE PLACEMENT(NO LINKED CHARGES)      Follow-up: Return in about 3 months (around 06/24/2017).   CMA/ATC served as Neurosurgeonscribe during this visit. History, Physical, and Plan performed by medical provider. Documentation and orders reviewed and attested to.      Gaspar BiddingMichael Elanie Hammitt, DO    [facility]

## 2017-03-24 NOTE — Addendum Note (Signed)
Addended by: Tempie HoistMCNEIL, Ryleigh Buenger M on: 03/24/2017 11:04 AM   Modules accepted: Orders

## 2017-03-25 LAB — CELL COUNT + DIFF, W/O CRYST-SYNVL FLD
Basophils, %: 0 %
Eosinophils-Synovial: 0 % (ref 0–2)
Lymphocytes-Synovial Fld: 58 % (ref 0–74)
Monocyte/Macrophage: 8 % (ref 0–69)
Neutrophil, Synovial: 31 % — ABNORMAL HIGH (ref 0–24)
Synoviocytes, %: 3 % (ref 0–15)
WBC, Synovial: 590 cells/uL — ABNORMAL HIGH (ref <150)

## 2017-03-25 NOTE — Addendum Note (Signed)
Addended by: Gaspar BiddingIGBY, MICHAEL D on: 03/25/2017 12:19 PM   Modules accepted: Orders

## 2017-03-26 LAB — SYNOVIAL FLUID, CRYSTAL

## 2017-05-04 ENCOUNTER — Telehealth: Payer: Self-pay | Admitting: Emergency Medicine

## 2017-05-04 DIAGNOSIS — G4733 Obstructive sleep apnea (adult) (pediatric): Secondary | ICD-10-CM

## 2017-05-04 NOTE — Telephone Encounter (Signed)
Spoke with pt, states that she has received her new cpap through the [facility] and is [facility].  Order placed.  Nothing further needed.

## 2017-06-07 ENCOUNTER — Encounter: Payer: Self-pay | Admitting: Emergency Medicine

## 2017-06-07 ENCOUNTER — Ambulatory Visit (INDEPENDENT_AMBULATORY_CARE_PROVIDER_SITE_OTHER): Payer: Medicare Other | Admitting: Emergency Medicine

## 2017-06-07 DIAGNOSIS — Z23 Encounter for immunization: Secondary | ICD-10-CM | POA: Diagnosis not present

## 2017-06-07 NOTE — Progress Notes (Signed)
Subjective:    Patient ID: Teresa Weiss, female    DOB: 09/23/1933, 83 y.o.   MRN: [id number]  HPI 83 year old woman, former smoker (~45 pack years) with hx HTN, hypothyroidism, psoriatic arthritis, OSA on CPAP. She was dx with COPD several years ago, has been on home O2 since early 2000's, 4-6L/min. Her son is R. Lundy with [employer]. She was recently admitted 1/11-1/19/18 for an AE of her COPD. She was treated with steroids, BD's, brief BiPAP. Benefited from morphine for sx of dyspnea after being evaluated by Dr Phillips Odor. Hospice care at some point in the future was discussed. Her code status was defined as DNR / DNI at that time. Based on her dscription it sounds like she has Home health but does not have Hospice. She presents today to establish care. Has been managed on Symbicort TID and Spiriva. She uses albuterol about 3-4x a day. She has had 2 exacerbations in the last year, but prior to that had been exacerbating much less frequently.   She believes that she is recovering but has not gotten back to her baseline since hospitalization. She is still tired, a bit weaker than her usual. Intermittently has some dyspnea - was SOB this am while getting ready for this visit. She isn't coughing, has minimal wheeze. She has a lot of clear nasal drainage, all day long. Has Afrin on her list for prn. She has been on flonase before without much effect.   Good compliance with her o2 and her CPAP.    CXR 10/21/16 some cardiac enlargement, emphysema, no evidence ILD.   ROV 02/01/17 -- This follow-up visit for severe COPD and obstructive sleep apnea. Also with chronic rhinitis. We started Atrovent nasal spray at her last visit to see if this would help - she has not benefited. She is out of flonase. She is currently managed on Symbicort, Spiriva. She has good compliance with her CPAP: 94% usage, 77% usage greater than 4 hours a night, good correction of her AHI. Some occasional leak. CPAP 12  cmH2O. She feels significant clinical benefit. No flares since last time. Uses albuterol very rarely.   ROV 06/07/17 -- patient has a history of severe COPD, OSA, chronic rhinitis, chronic hypoxemic respiratory failure. She has been managed on Symbicort and Spiriva. She uses oxygen 3-6 L/m. Since our last visit she got a new CPAP machine - she tells me that she has had increased nocturnal awakenings, has waked up and documented desaturations. She is having more leakage - she believes that this may be the problem. Full face mask. She has some days when she cannot do much - very wiped out. unclear that this correlates with her sleep quality vs COPD vs edema    Review of Systems  Constitutional: Negative.  Negative for fever and unexpected weight change.  HENT: Positive for postnasal drip and rhinorrhea. Negative for congestion, dental problem, ear pain, nosebleeds, sinus pressure, sneezing, sore throat and trouble swallowing.   Eyes: Negative.  Negative for redness and itching.  Respiratory: Positive for shortness of breath. Negative for cough, chest tightness and wheezing.   Cardiovascular: Positive for leg swelling. Negative for palpitations.  Gastrointestinal: Negative.  Negative for nausea and vomiting.  Genitourinary: Negative.  Negative for dysuria.  Musculoskeletal: Negative.  Negative for joint swelling.  Skin: Negative.  Negative for rash.  Neurological: Negative.  Negative for headaches.  Hematological: Bruises/bleeds easily.  Psychiatric/Behavioral: Negative.  Negative for dysphoric mood. The patient is not nervous/anxious.  Past Medical History:  Diagnosis Date  . Chicken pox   . COPD (chronic obstructive pulmonary disease) (HCC)   . Depression   . Diverticulitis   . Essential hypertension   . Heart murmur   . Hyperlipidemia   . Hypothyroidism   . Oxygen dependent   . PAD (peripheral artery disease) (HCC)   . Psoriatic arthritis (HCC)   . Sleep apnea   . Urinary  incontinence      Family History  Problem Relation Age of Onset  . Hyperlipidemia Father   . Hypertension Father   . Breast cancer Maternal Grandmother   . Heart attack Maternal Grandfather   . Heart disease Maternal Uncle      Social History   Social History  . Marital status: Legally Separated    Spouse name: N/A  . Number of children: N/A  . Years of education: N/A   Occupational History  . Not on file.   Social History Main Topics  . Smoking status: Former Smoker    Packs/day: 1.00    Types: Cigarettes    Quit date: 10/12/1991  . Smokeless tobacco: Never Used  . Alcohol use Yes     Comment:  socially  . Drug use: No  . Sexual activity: Not on file   Other Topics Concern  . 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 Son lives in [place] and works at [employer].    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 Infliximab Shortness Of Breath    Respiratory complications.  . Vicodin [Hydrocodone-Acetaminophen] Nausea And Vomiting     Outpatient Medications Prior to Visit  Medication Sig Dispense Refill  . albuterol (PROVENTIL HFA;VENTOLIN HFA) 108 (90 Base) MCG/ACT inhaler Inhale 1-2 puffs into the lungs every 6 (six) hours as needed for wheezing or shortness of breath.    Marland Kitchen atenolol (TENORMIN) 25 MG tablet Take 1 tablet (25 mg total) by mouth at bedtime. 90 tablet 3  . budesonide-formoterol (SYMBICORT) 160-4.5 MCG/ACT inhaler Inhale 2 puffs into the lungs 3 (three) times daily.    . cholecalciferol (VITAMIN D) 1000 units tablet Take 1,000 Units by mouth daily.    . fluticasone (FLONASE) 50 MCG/ACT nasal spray Place 2 sprays into both nostrils daily. 16 g 3  . furosemide (LASIX) 20 MG tablet Take 1 tablet (20 mg total) by mouth daily. 90 tablet 1  . guaiFENesin (MUCINEX) 600 MG 12 hr tablet Take  2 tablets (1,200 mg total) by mouth 2 (two) times daily. 60 tablet 0  . ibuprofen (ADVIL,MOTRIN) 200 MG tablet Take 200 mg by mouth every 6 (six) hours as needed for moderate pain.    Marland Kitchen ipratropium (ATROVENT) 0.03 % nasal spray Place 2 sprays into both nostrils every 12 (twelve) hours. 30 mL 6  . ipratropium-albuterol (DUONEB) 0.5-2.5 (3) MG/3ML SOLN Take 3 mLs by nebulization 4 (four) times daily. 360 mL 0  . levothyroxine (SYNTHROID, LEVOTHROID) 150 MCG tablet Take 1 tablet by mouth daily.    . meloxicam (MOBIC) 15 MG tablet Take 15 mg by mouth daily.    . mirtazapine (REMERON) 15 MG tablet Take 1 tablet (15 mg total) by mouth at bedtime. 90 tablet 1  . Morphine Sulfate (MORPHINE CONCENTRATE) 10 MG/0.5ML SOLN concentrated solution Place  0.5 mLs (10 mg total) under the tongue every 4 (four) hours as needed for severe pain, anxiety or shortness of breath (dyspnea/air hunger). 30 mL 0  . Multiple Vitamin (MULTIVITAMIN) tablet Take 1 tablet by mouth daily.    . polyethylene glycol (MIRALAX / GLYCOLAX) packet Take 17 g by mouth 2 (two) times daily. 14 each 0  . potassium chloride (K-DUR) 10 MEQ tablet Take 1 tablet (10 mEq total) by mouth daily. 90 tablet 2  . pramipexole (MIRAPEX) 1.5 MG tablet Take 1 tablet (1.5 mg total) by mouth at bedtime. Take 1/2 tablet at noon and 1 tablet at bedtime 90 tablet 0  . senna-docusate (SENOKOT-S) 8.6-50 MG tablet Take 2 tablets by mouth at bedtime. 30 tablet 0  . sodium chloride (OCEAN) 0.65 % SOLN nasal spray Place 1 spray into both nostrils as needed for congestion. 1 Bottle 0  . tiotropium (SPIRIVA) 18 MCG inhalation capsule Place 18 mcg into inhaler and inhale daily.     Marland Kitchen azelastine (ASTELIN) 0.1 % nasal spray Place 2 sprays into both nostrils 2 (two) times daily. Use in each nostril as directed 30 mL 3   No facility-administered medications prior to visit.         Objective:   Physical Exam Vitals:   06/07/17 0901  BP: 134/82  Pulse: 84  SpO2: 95%    Weight: 209 lb 3.2 oz (94.9 kg)  Height: 5\' 6"  (1.676 m)   Gen: Pleasant, overwt, in no distress,  normal affect  ENT: No lesions,  mouth clear,  oropharynx clear, no postnasal drip  Neck: No JVD, no stridor  Lungs: No use of accessory muscles, distant, wheeze only a forced expiration  Cardiovascular: RRR, heart sounds normal, no murmur or gallops, 1+ pretibial edema B  Musculoskeletal: No deformities, no cyanosis or clubbing  Neuro: alert, non focal  Skin: Warm, no lesions or rashes     Assessment & Plan:  COPD (chronic obstructive pulmonary disease) (HCC) Please continue Spiriva and Symbicort as you are taking them  Take albuterol 2 puffs up to every 4 hours if needed for shortness of breath.  Wear your oxygen at all times as you have been doing.  Follow with Dr Delton Coombes in 3 months or sooner if you have any problems.  Obstructive sleep apnea We will try to arrange for a CPAP mask fitting to see if we can avoid leakage We will change your CPAP pressures to auto-set mode to see if this improves your pressure delivery.   Levy Pupa, MD, PhD 06/07/2017, 9:40 AM [facility] [phone] or if no answer [phone]

## 2017-06-07 NOTE — Assessment & Plan Note (Signed)
We will try to arrange for a CPAP mask fitting to see if we can avoid leakage We will change your CPAP pressures to auto-set mode to see if this improves your pressure delivery.

## 2017-06-07 NOTE — Assessment & Plan Note (Signed)
Please continue Spiriva and Symbicort as you are taking them  Take albuterol 2 puffs up to every 4 hours if needed for shortness of breath.  Wear your oxygen at all times as you have been doing.  Follow with Dr Delton Coombes in 3 months or sooner if you have any problems.

## 2017-06-07 NOTE — Patient Instructions (Signed)
We will try to arrange for a CPAP mask fitting to see if we can avoid leakage We will change your CPAP pressures to auto-set mode to see if this improves your pressure delivery.  Please continue Spiriva and Symbicort as you are taking them  Take albuterol 2 puffs up to every 4 hours if needed for shortness of breath.  Wear your oxygen at all times as you have been doing.  Follow with Dr Delton Coombes in 3 months or sooner if you have any problems.

## 2017-06-16 ENCOUNTER — Encounter: Payer: Self-pay | Admitting: Emergency Medicine

## 2017-06-16 DIAGNOSIS — G4733 Obstructive sleep apnea (adult) (pediatric): Secondary | ICD-10-CM

## 2017-06-17 ENCOUNTER — Encounter: Payer: Self-pay | Admitting: Emergency Medicine

## 2017-06-17 NOTE — Telephone Encounter (Signed)
cpap pressure change and mask fit session were not ordered at last ov.  These have since been ordered.

## 2017-06-20 ENCOUNTER — Telehealth: Payer: Self-pay | Admitting: Adult Health

## 2017-06-20 NOTE — Telephone Encounter (Signed)
Pt state that she is having severe pain on her L side sciatic and would like to have something for the pain.  PharmRenaee Munda:  [facility]

## 2017-06-20 NOTE — Telephone Encounter (Signed)
Agree with plan.  Will assess in the morning

## 2017-06-20 NOTE — Telephone Encounter (Signed)
Patient reports sharp, shooting, intermittent L sided leg pain that radiates down the back of her femur. She has h/o lumbar laminectomy and sciatica and had a nerve block w/ [facility] about 5 years ago.  She has tried OTC naproxen and Tylenol w/ no relief.   Advised patient she could use ice pack/cold compress if helpful and that an office visit would be most appropriate for assessment.  Scheduled tomorrow morning w/ Dr. Kirtland BouchardK, as this is the only time she has transportation.  Please advise if any additional recommendations for pain control until appointment tomorrow? PCP is out office today.

## 2017-06-20 NOTE — Telephone Encounter (Signed)
Pt notified of instructions and verbalized understanding. 

## 2017-06-20 NOTE — Telephone Encounter (Signed)
Called patient and left message to return call.  Chart review, she has not been seen recently for sciatica.  Recommended she schedule an appointment for further evaluation.

## 2017-06-21 ENCOUNTER — Ambulatory Visit: Payer: Medicare Other | Admitting: Internal Medicine

## 2017-06-23 ENCOUNTER — Ambulatory Visit: Payer: Medicare Other | Admitting: Sports Medicine

## 2017-06-28 ENCOUNTER — Ambulatory Visit: Payer: Medicare Other | Admitting: Sports Medicine

## 2017-06-30 ENCOUNTER — Ambulatory Visit: Payer: Medicare Other | Admitting: Sports Medicine

## 2017-07-01 ENCOUNTER — Encounter: Payer: Self-pay | Admitting: Adult Health

## 2017-07-05 ENCOUNTER — Ambulatory Visit (INDEPENDENT_AMBULATORY_CARE_PROVIDER_SITE_OTHER): Payer: Medicare Other | Admitting: Sports Medicine

## 2017-07-05 ENCOUNTER — Ambulatory Visit: Payer: Self-pay

## 2017-07-05 ENCOUNTER — Encounter: Payer: Self-pay | Admitting: Sports Medicine

## 2017-07-05 VITALS — BP 122/84 | HR 84 | Ht 66.0 in | Wt 210.2 lb

## 2017-07-05 DIAGNOSIS — M1711 Unilateral primary osteoarthritis, right knee: Secondary | ICD-10-CM

## 2017-07-05 NOTE — Patient Instructions (Addendum)
You had an injection today.  Things to be aware of after injection are listed below: . You may experience no significant improvement or even a slight worsening in your symptoms during the first 24 to 48 hours.  After that we expect your symptoms to improve gradually over the next 2 weeks for the medicine to have its maximal effect.  You should continue to have improvement out to 6 weeks after your injection. . Dr. Rigby recommends icing the site of the injection for 20 minutes  1-2 times the day of your injection . You may shower but no swimming, tub bath or Jacuzzi for 24 hours. . If your bandage falls off this does not need to be replaced.  It is appropriate to remove the bandage after 4 hours. . You may resume light activities as tolerated unless otherwise directed per Dr. Rigby during your visit  POSSIBLE STEROID SIDE EFFECTS:  Side effects from injectable steroids tend to be less than when taken orally however you may experience some of the symptoms listed below.  If experienced these should only last for a short period of time. Change in menstrual flow  Edema (swelling)  Increased appetite Skin flushing (redness)  Skin rash/acne  Thrush (oral) Yeast vaginitis    Increased sweating  Depression Increased blood glucose levels Cramping and leg/calf  Euphoria (feeling happy)  POSSIBLE PROCEDURE SIDE EFFECTS: The side effects of the injection are usually fairly minimal however if you may experience some of the following side effects that are usually self-limited and will is off on their own.  If you are concerned please feel free to call the office with questions:  Increased numbness or tingling  Nausea or vomiting  Swelling or bruising at the injection site   Please call our office if if you experience any of the following symptoms over the next week as these can be signs of infection:   Fever greater than 100.5F  Significant swelling at the injection site  Significant redness or drainage  from the injection site  If after 2 weeks you are continuing to have worsening symptoms please call our office to discuss what the next appropriate actions should be including the potential for a return office visit or other diagnostic testing.   I recommend you obtained a compression sleeve to help with your joint problems. There are many options on the market however I recommend obtaining a knee Body Helix compression sleeve.  You can find information (including how to appropriate measure yourself for sizing) can be found at www.Body Helix.com.  Many of these products are health savings account (HSA) eligible.   You can use the compression sleeve at any time throughout the day but is most important to use while being active as well as for 2 hours post-activity.   It is appropriate to ice following activity with the compression sleeve in place.    

## 2017-07-05 NOTE — Progress Notes (Signed)
OFFICE VISIT NOTE [name]. [name] [facility] at [facility] [phone]  [name] - 83 y.o. female MRN [id number]  Date of birth: 02-May-1933  Visit Date: 07/05/2017  PCP: Shirline Frees, NP   Referred by: Shirline Frees, NP  Orlie Dakin, CMA acting as scribe for Dr. Berline Chough.  SUBJECTIVE:   Chief Complaint  Patient presents with  . Follow-up    osteoarthritis RT knee   HPI: As below and per problem based documentation when appropriate.  Teresa Weiss is an established patient presenting today in follow-up of osteoarthritis of the RT knee. She was last seen 03/24/2017 and had aspiration of joint fluid and received steroid injection. Synovial fluid was checked and came back showing uric acid and calcium pyrophosphate crystals. She was provided with DonJoy Reaction knee brace and advised to take Meloxicam.   Pt states that her R knee is about the same as the last time.  She reports that the injection from her last visit did temporarily relieve her pain but notes that her symptoms are now the same as they were before her last visit.  Pt notes that she continues to take the Meloxicam but isn't sure if it is helping or not.  Pt states that she hasn't worn the DonJoy brace much as she doesn't feel like it has been helping and notes that she doesn't feel like the brace has been providing much stability.  Pt was informed about her lab results indicating that she has gout.  Pt notes that she is afraid of her current R knee situation due to her history of a torn L ACL and eventual L knee TKR.    Review of Systems  Constitutional: Negative.   HENT: Negative.   Eyes: Negative.   Respiratory: Positive for shortness of breath.   Cardiovascular: Negative.   Gastrointestinal: Negative.   Musculoskeletal: Positive for back pain and joint pain.  Skin: Negative.   Neurological: Positive for tingling.  Psychiatric/Behavioral: Positive for  memory loss.    Otherwise per HPI.  HISTORY & PERTINENT PRIOR DATA:  No specialty comments available. She reports that she quit smoking about 25 years ago. Her smoking use included Cigarettes. She smoked 1.00 pack per day. She has never used smokeless tobacco. No results for input(s): HGBA1C, LABURIC in the last 8760 hours. Allergies reviewed per EMR Prior to Admission medications   Medication Sig Start Date End Date Taking? Authorizing Provider  albuterol (PROVENTIL HFA;VENTOLIN HFA) 108 (90 Base) MCG/ACT inhaler Inhale 1-2 puffs into the lungs every 6 (six) hours as needed for wheezing or shortness of breath.    [provider]  atenolol (TENORMIN) 25 MG tablet Take 1 tablet (25 mg total) by mouth at bedtime. 10/07/16   Nafziger, Kandee Keen, NP  budesonide-formoterol (SYMBICORT) 160-4.5 MCG/ACT inhaler Inhale 2 puffs into the lungs 3 (three) times daily.    [provider]  cholecalciferol (VITAMIN D) 1000 units tablet Take 1,000 Units by mouth daily.    [provider]  fluticasone (FLONASE) 50 MCG/ACT nasal spray Place 2 sprays into both nostrils daily. 02/01/17   Leslye Peer, MD  furosemide (LASIX) 20 MG tablet Take 1 tablet (20 mg total) by mouth daily. 11/26/16   Nafziger, Kandee Keen, NP  guaiFENesin (MUCINEX) 600 MG 12 hr tablet Take 2 tablets (1,200 mg total) by mouth 2 (two) times daily. 10/29/16   Rolly Salter, MD  ibuprofen (ADVIL,MOTRIN) 200 MG tablet Take 200 mg by  mouth every 6 (six) hours as needed for moderate pain.    [provider]  ipratropium (ATROVENT) 0.03 % nasal spray Place 2 sprays into both nostrils every 12 (twelve) hours. 11/09/16   Leslye Peer, MD  ipratropium-albuterol (DUONEB) 0.5-2.5 (3) MG/3ML SOLN Take 3 mLs by nebulization 4 (four) times daily. 10/29/16   Rolly Salter, MD  levothyroxine (SYNTHROID, LEVOTHROID) 150 MCG tablet Take 1 tablet by mouth daily.    [provider]  meloxicam (MOBIC) 15 MG tablet Take 15 mg by  mouth daily.    [provider]  mirtazapine (REMERON) 15 MG tablet Take 1 tablet (15 mg total) by mouth at bedtime. 11/26/16   Nafziger, Kandee Keen, NP  Morphine Sulfate (MORPHINE CONCENTRATE) 10 MG/0.5ML SOLN concentrated solution Place 0.5 mLs (10 mg total) under the tongue every 4 (four) hours as needed for severe pain, anxiety or shortness of breath (dyspnea/air hunger). 10/29/16   Rolly Salter, MD  Multiple Vitamin (MULTIVITAMIN) tablet Take 1 tablet by mouth daily.    [provider]  polyethylene glycol (MIRALAX / GLYCOLAX) packet Take 17 g by mouth 2 (two) times daily. 10/29/16   Rolly Salter, MD  potassium chloride (K-DUR) 10 MEQ tablet Take 1 tablet (10 mEq total) by mouth daily. 02/07/17   Nafziger, Kandee Keen, NP  pramipexole (MIRAPEX) 1.5 MG tablet Take 1 tablet (1.5 mg total) by mouth at bedtime. Take 1/2 tablet at noon and 1 tablet at bedtime 03/16/17   Nafziger, Kandee Keen, NP  senna-docusate (SENOKOT-S) 8.6-50 MG tablet Take 2 tablets by mouth at bedtime. 10/29/16   Rolly Salter, MD  sodium chloride (OCEAN) 0.65 % SOLN nasal spray Place 1 spray into both nostrils as needed for congestion. 10/29/16   Rolly Salter, MD  tiotropium (SPIRIVA) 18 MCG inhalation capsule Place 18 mcg into inhaler and inhale daily.     [provider]   Patient Active Problem List   Diagnosis Date Noted  . Primary osteoarthritis of right knee 03/24/2017  . Obstructive sleep apnea 11/09/2016  . Chronic vasomotor rhinitis 11/09/2016  . Hyperlipidemia   . Anxiety state   . Pain   . Palliative care by specialist   . Goals of care, counseling/discussion   . Encounter for hospice care discussion   . Chronic hypoxemic respiratory failure (HCC) 10/21/2016  . Chest tightness 10/21/2016  . Essential hypertension 10/21/2016  . Hypothyroidism 10/21/2016  . Depression 10/21/2016  . RLS (restless legs syndrome) 10/21/2016  . COPD (chronic obstructive pulmonary disease) (HCC) 10/13/2016   Past  Medical History:  Diagnosis Date  . Chicken pox   . COPD (chronic obstructive pulmonary disease) (HCC)   . Depression   . Diverticulitis   . Essential hypertension   . Heart murmur   . Hyperlipidemia   . Hypothyroidism   . Oxygen dependent   . PAD (peripheral artery disease) (HCC)   . Psoriatic arthritis (HCC)   . Sleep apnea   . Urinary incontinence    Family History  Problem Relation Age of Onset  . Hyperlipidemia Father   . Hypertension Father   . Breast cancer Maternal Grandmother   . Heart attack Maternal Grandfather   . Heart disease Maternal Uncle    Past Surgical History:  Procedure Laterality Date  . ABDOMINAL HYSTERECTOMY  1977  . Appendectomy    . CATARACT EXTRACTION Bilateral   . COLON RESECTION    . INGUINAL HERNIA REPAIR    . Knee replacement  Left   .  LUMBAR LAMINECTOMY    . VEIN LIGATION AND STRIPPING     Social History   Occupational History  . Not on file.   Social History Main Topics  . Smoking status: Former Smoker    Packs/day: 1.00    Types: Cigarettes    Quit date: 10/12/1991  . Smokeless tobacco: Never Used  . Alcohol use Yes     Comment:  socially  . Drug use: No  . Sexual activity: Not on file    OBJECTIVE:  VS:  HT:5\' 6"  (167.6 cm)   WT:210 lb 3.2 oz (95.3 kg)  BMI:33.94    BP:122/84  HR:84bpm  TEMP: ( )  RESP:94 % EXAM: Findings:  Right lower extremity has a fairly significant valgus deformity but overall well-maintained flexion extension.  She has 4 mm of opening with varus testing but has a solid endpoint.  Pain with patellar grind as well as palpation of the medial and lateral joint lines.  Small to moderate effusion on exam.  No significant lower extremity edema.    RADIOLOGY:  ASSESSMENT & PLAN:     ICD-10-CM   1. Primary osteoarthritis of right knee M17.11 US GUIDED NEEDLE PLACEMENT(NO LINKED CHARGES)    Synovial cell count + diff, w/ crystals    ================================================================= Primary osteoarthritis of right knee Patient has failed conservative measures with corticosteroid injections.  We will proceed with Orthovisc series.  We will plan to send for cell count and crystal analysis once again to ensure no persistent gout.  If persistent uric acid crystals will need uric acid level drawn at next follow-up visit.  PROCEDURE NOTE - ULTRASOUND GUIDED ASPIRATION & INJECTION: RIGHT KNEE #1 of 3 Orthovisc injections Images were obtained and interpreted by myself, Gaspar Bidding, DO  Images have been saved and stored to PACS system. Images obtained on: GE S7 Ultrasound machine  ULTRASOUND FINDINGS: Moderate effusion with marked generalized synovitis and osteophytic spurring  DESCRIPTION OF PROCEDURE:  The patient's clinical condition is marked by substantial pain and/or significant functional disability. Other conservative therapy has not provided relief, is contraindicated, or not appropriate. There is a reasonable likelihood that injection will significantly improve the patient's pain and/or functional impairment. After discussing the risks, benefits and expected outcomes of the injection and all questions were reviewed and answered, the patient wished to undergo the above named procedure. Verbal consent was obtained. The ultrasound was used to identify the target structure and adjacent neurovascular structures. The skin was then prepped in sterile fashion and the target structure was injected under direct visualization using sterile technique as below: PREP: Alcohol, Ethel Chloride APPROACH: Superiolateral, stopcock technique, 21g 2" needle INJECTATE: 5cc 1% lidocaine, 2 cc of 22 mg (  total) Orthovisc ASPIRATE: 22cc thick but clear straw-colored synovial fluid DRESSING: Band-Aid and Body Helix compression sleeve  Post procedural instructions including recommending icing and warning signs for  infection were reviewed. This procedure was well tolerated and there were no complications.   IMPRESSION: Succesful US Guided Aspiration & injection Orthovisc No. 1 of 3  =================================================================  Follow-up: Return in about 9 days (around 07/14/2017).   CMA/ATC served as Neurosurgeon during this visit. History, Physical, and Plan performed by medical provider. Documentation and orders reviewed and attested to.      Gaspar Bidding, DO    [facility] Physician

## 2017-07-05 NOTE — Procedures (Signed)
PROCEDURE NOTE - ULTRASOUND GUIDED ASPIRATION & INJECTION: RIGHT KNEE #1 of 3 Orthovisc injections Images were obtained and interpreted by myself, Gaspar Bidding, DO  Images have been saved and stored to PACS system. Images obtained on: GE S7 Ultrasound machine  ULTRASOUND FINDINGS: Moderate effusion with marked generalized synovitis and osteophytic spurring  DESCRIPTION OF PROCEDURE:  The patient's clinical condition is marked by substantial pain and/or significant functional disability. Other conservative therapy has not provided relief, is contraindicated, or not appropriate. There is a reasonable likelihood that injection will significantly improve the patient's pain and/or functional impairment. After discussing the risks, benefits and expected outcomes of the injection and all questions were reviewed and answered, the patient wished to undergo the above named procedure. Verbal consent was obtained. The ultrasound was used to identify the target structure and adjacent neurovascular structures. The skin was then prepped in sterile fashion and the target structure was injected under direct visualization using sterile technique as below: PREP: Alcohol, Ethel Chloride APPROACH: Superiolateral, stopcock technique, 21g 2" needle INJECTATE: 5cc 1% lidocaine, 2 cc of 22 mg (  total) Orthovisc ASPIRATE: 22cc thick but clear straw-colored synovial fluid DRESSING: Band-Aid and Body Helix compression sleeve  Post procedural instructions including recommending icing and warning signs for infection were reviewed. This procedure was well tolerated and there were no complications.   IMPRESSION: Succesful US Guided Aspiration & injection Orthovisc No. 1 of 3

## 2017-07-05 NOTE — Assessment & Plan Note (Signed)
Patient has failed conservative measures with corticosteroid injections.  We will proceed with Orthovisc series.  We will plan to send for cell count and crystal analysis once again to ensure no persistent gout.  If persistent uric acid crystals will need uric acid level drawn at next follow-up visit.

## 2017-07-06 LAB — SYNOVIAL CELL COUNT + DIFF, W/ CRYSTALS
BASOPHILS, %: 0 %
EOSINOPHILS-SYNOVIAL: 0 % (ref 0–2)
LYMPHOCYTES-SYNOVIAL FLD: 5 % (ref 0–74)
MONOCYTE/MACROPHAGE: 40 % (ref 0–69)
Neutrophil, Synovial: 32 % — ABNORMAL HIGH (ref 0–24)
SYNOVIOCYTES, %: 23 % — AB (ref 0–15)
WBC, Synovial: 533 cells/uL — ABNORMAL HIGH (ref <150)

## 2017-07-14 ENCOUNTER — Encounter: Payer: Self-pay | Admitting: Sports Medicine

## 2017-07-14 ENCOUNTER — Ambulatory Visit (INDEPENDENT_AMBULATORY_CARE_PROVIDER_SITE_OTHER): Payer: Medicare Other | Admitting: Sports Medicine

## 2017-07-14 ENCOUNTER — Ambulatory Visit: Payer: Self-pay

## 2017-07-14 VITALS — BP 130/86 | HR 100 | Ht 66.0 in | Wt 209.2 lb

## 2017-07-14 DIAGNOSIS — M1711 Unilateral primary osteoarthritis, right knee: Secondary | ICD-10-CM | POA: Diagnosis not present

## 2017-07-14 NOTE — Patient Instructions (Signed)

## 2017-07-14 NOTE — Procedures (Signed)
PROCEDURE NOTE - ULTRASOUND GUIDED ASPIRATION & INJECTION: Right Knee Orthovisc #2/3 Images were obtained and interpreted by myself, Gaspar Bidding, DO  Images have been saved and stored to PACS system. Images obtained on: GE S7 Ultrasound machine  ULTRASOUND FINDINGS: Moderate effusion  DESCRIPTION OF PROCEDURE:  The patient's clinical condition is marked by substantial pain and/or significant functional disability. Other conservative therapy has not provided relief, is contraindicated, or not appropriate. There is a reasonable likelihood that injection will significantly improve the patient's pain and/or functional impairment. After discussing the risks, benefits and expected outcomes of the injection and all questions were reviewed and answered, the patient wished to undergo the above named procedure. Verbal consent was obtained. The ultrasound was used to identify the target structure and adjacent neurovascular structures. The skin was then prepped in sterile fashion and the target structure was injected under direct visualization using sterile technique as below: PREP: Alcohol, Ethel Chloride, 3cc 1% lidocaine on 25 needle APPROACH: Superiolateral, stopcock technique, 18g 1.5" needle INJECTATE: 2cc ( ) Orthovisc ASPIRATE: 28cc serous fluid  DRESSING: Band-Aid & Pts own body helix  Post procedural instructions including recommending icing and warning signs for infection were reviewed. This procedure was well tolerated and there were no complications.   IMPRESSION: Succesful US Guided Aspiration & injection

## 2017-07-14 NOTE — Progress Notes (Signed)
OFFICE VISIT NOTE Teresa Weiss. Teresa Weiss Sports Medicine [facility] at [facility] [phone]  Teresa Weiss Teresa Weiss - 83 y.o. female MRN [id number]  Date of birth: [birth year]  Visit Date: 07/14/2017  PCP: Teresa Frees, NP   Referred by: Teresa Frees, NP  Teresa Weiss, CMA acting as scribe for Dr. Berline Weiss.  SUBJECTIVE:   Chief Complaint  Patient presents with  . Follow-up    RT knee effusion   HPI: As below and per problem based documentation when appropriate.  Teresa Weiss is an established patient presenting today is follow-up of osteoarthritis of the RT knee. She was last seen 07/05/2017 and received Orthovisc injection. 22 CC of synovial fluid was aspirated and showed no evidence of gout.   Pt reports minimal change with knee pain since her last visit. She cannot tell if her knee is swollen because she says "my knees are big". She is currently using a walker to ambulate. She is still taking Meloxicam prn. She has been wearing the Body Helix compression sleeve and feels like this gives her more support.     Review of Systems  Constitutional: Negative.   HENT: Negative.   Eyes: Negative.   Respiratory: Positive for shortness of breath.   Cardiovascular: Negative.   Gastrointestinal: Negative.   Genitourinary: Negative.   Musculoskeletal: Positive for joint pain and myalgias.  Skin: Negative.   Neurological: Positive for tingling. Negative for dizziness and headaches.    Otherwise per HPI.  HISTORY & PERTINENT PRIOR DATA:  No specialty comments available. She reports that she quit smoking about 25 years ago. Her smoking use included Cigarettes. She smoked 1.00 pack per day. She has never used smokeless tobacco. No results for input(s): HGBA1C, LABURIC in the last 8760 hours. Allergies reviewed per EMR Prior to Admission medications   Medication Sig Start Date End Date Taking? Authorizing Provider  albuterol (PROVENTIL HFA;VENTOLIN HFA)  108 (90 Base) MCG/ACT inhaler Inhale 1-2 puffs into the lungs every 6 (six) hours as needed for wheezing or shortness of breath.   Yes [provider]  atenolol (TENORMIN) 25 MG tablet Take 1 tablet (25 mg total) by mouth at bedtime. 10/07/16  Yes Nafziger, Kandee Keen, NP  budesonide-formoterol (SYMBICORT) 160-4.5 MCG/ACT inhaler Inhale 2 puffs into the lungs 3 (three) times daily.   Yes [provider]  cholecalciferol (VITAMIN D) 1000 units tablet Take 1,000 Units by mouth daily.   Yes [provider]  fluticasone (FLONASE) 50 MCG/ACT nasal spray Place 2 sprays into both nostrils daily. 02/01/17  Yes Teresa Peer, MD  furosemide (LASIX) 20 MG tablet Take 1 tablet (20 mg total) by mouth daily. 11/26/16  Yes Nafziger, Kandee Keen, NP  guaiFENesin (MUCINEX) 600 MG 12 hr tablet Take 2 tablets (1,200 mg total) by mouth 2 (two) times daily. 10/29/16  Yes Teresa Salter, MD  ibuprofen (ADVIL,MOTRIN) 200 MG tablet Take 200 mg by mouth every 6 (six) hours as needed for moderate pain.   Yes [provider]  ipratropium (ATROVENT) 0.03 % nasal spray Place 2 sprays into both nostrils every 12 (twelve) hours. 11/09/16  Yes Teresa Peer, MD  ipratropium-albuterol (DUONEB) 0.5-2.5 (3) MG/3ML SOLN Take 3 mLs by nebulization 4 (four) times daily. 10/29/16  Yes Teresa Salter, MD  levothyroxine (SYNTHROID, LEVOTHROID) 150 MCG tablet Take 1 tablet by mouth daily.   Yes [provider]  meloxicam (MOBIC) 15 MG tablet Take 15 mg by mouth daily.   Yes [provider]  mirtazapine (REMERON) 15 MG tablet Take 1 tablet (15 mg total) by mouth at bedtime. 11/26/16  Yes Nafziger, Kandee Keen, NP  Morphine Sulfate (MORPHINE CONCENTRATE) 10 MG/0.5ML SOLN concentrated solution Place 0.5 mLs (10 mg total) under the tongue every 4 (four) hours as needed for severe pain, anxiety or shortness of breath (dyspnea/air hunger). 10/29/16  Yes Teresa Salter, MD  Multiple Vitamin (MULTIVITAMIN) tablet  Take 1 tablet by mouth daily.   Yes [provider]  polyethylene glycol (MIRALAX / GLYCOLAX) packet Take 17 g by mouth 2 (two) times daily. 10/29/16  Yes Teresa Salter, MD  potassium chloride (K-DUR) 10 MEQ tablet Take 1 tablet (10 mEq total) by mouth daily. 02/07/17  Yes Nafziger, Kandee Keen, NP  pramipexole (MIRAPEX) 1.5 MG tablet Take 1 tablet (1.5 mg total) by mouth at bedtime. Take 1/2 tablet at noon and 1 tablet at bedtime 03/16/17  Yes Nafziger, Kandee Keen, NP  senna-docusate (SENOKOT-S) 8.6-50 MG tablet Take 2 tablets by mouth at bedtime. 10/29/16  Yes Teresa Salter, MD  sodium chloride (OCEAN) 0.65 % SOLN nasal spray Place 1 spray into both nostrils as needed for congestion. 10/29/16  Yes Teresa Salter, MD  tiotropium (SPIRIVA) 18 MCG inhalation capsule Place 18 mcg into inhaler and inhale daily.    Yes [provider]   Patient Active Problem List   Diagnosis Date Noted  . Primary osteoarthritis of right knee 03/24/2017  . Obstructive sleep apnea 11/09/2016  . Chronic vasomotor rhinitis 11/09/2016  . Hyperlipidemia   . Anxiety state   . Pain   . Palliative care by specialist   . Goals of care, counseling/discussion   . Encounter for hospice care discussion   . Chronic hypoxemic respiratory failure (HCC) 10/21/2016  . Chest tightness 10/21/2016  . Essential hypertension 10/21/2016  . Hypothyroidism 10/21/2016  . Depression 10/21/2016  . RLS (restless legs syndrome) 10/21/2016  . COPD (chronic obstructive pulmonary disease) (HCC) 10/13/2016   Past Medical History:  Diagnosis Date  . Chicken pox   . COPD (chronic obstructive pulmonary disease) (HCC)   . Depression   . Diverticulitis   . Essential hypertension   . Heart murmur   . Hyperlipidemia   . Hypothyroidism   . Oxygen dependent   . PAD (peripheral artery disease) (HCC)   . Psoriatic arthritis (HCC)   . Sleep apnea   . Urinary incontinence    Family History  Problem Relation Age of Onset  .  Hyperlipidemia Father   . Hypertension Father   . Breast cancer Maternal Grandmother   . Heart attack Maternal Grandfather   . Heart disease Maternal Uncle    Past Surgical History:  Procedure Laterality Date  . ABDOMINAL HYSTERECTOMY  1977  . Appendectomy    . CATARACT EXTRACTION Bilateral   . COLON RESECTION    . INGUINAL HERNIA REPAIR    . Knee replacement  Left   . LUMBAR LAMINECTOMY    . VEIN LIGATION AND STRIPPING     Social History   Occupational History  . Not on file.   Social History Main Topics  . Smoking status: Former Smoker    Packs/day: 1.00    Types: Cigarettes    Quit date: 10/12/1991  . Smokeless tobacco: Never Used  . Alcohol use Yes     Comment:  socially  . Drug use: No  . Sexual activity: Not on file    OBJECTIVE:  VS:  HT:5\' 6"  (167.6 cm)  WT:209 lb 3.2 oz (94.9 kg)  BMI:33.78    BP:130/86  HR:100bpm  TEMP: ( )  RESP:97 % EXAM: Findings:  Moderate effusion, valgus deformity, generalized osteoarthritic bossing.  No significant overlying skin changes.Marland Kitchen    RADIOLOGY:  ASSESSMENT & PLAN:     ICD-10-CM   1. Primary osteoarthritis of right knee M17.11 [place] LIMITED JOINT SPACE STRUCTURES LOW RIGHT(NO LINKED CHARGES)   =================================================================  PROCEDURE NOTE - ULTRASOUND GUIDED ASPIRATION & INJECTION: Right Knee Orthovisc #2/3 Images were obtained and interpreted by myself, Gaspar Bidding, DO  Images have been saved and stored to PACS system. Images obtained on: GE S7 Ultrasound machine  ULTRASOUND FINDINGS: Moderate effusion  DESCRIPTION OF PROCEDURE:  The patient's clinical condition is marked by substantial pain and/or significant functional disability. Other conservative therapy has not provided relief, is contraindicated, or not appropriate. There is a reasonable likelihood that injection will significantly improve the patient's pain and/or functional impairment. After discussing the risks,  benefits and expected outcomes of the injection and all questions were reviewed and answered, the patient wished to undergo the above named procedure. Verbal consent was obtained. The ultrasound was used to identify the target structure and adjacent neurovascular structures. The skin was then prepped in sterile fashion and the target structure was injected under direct visualization using sterile technique as below: PREP: Alcohol, Ethel Chloride, 3cc 1% lidocaine on 25 needle APPROACH: Superiolateral, stopcock technique, 18g 1.5" needle INJECTATE: 2cc ( ) Orthovisc ASPIRATE: 28cc serous fluid  DRESSING: Band-Aid & Pts own body helix  Post procedural instructions including recommending icing and warning signs for infection were reviewed. This procedure was well tolerated and there were no complications.   IMPRESSION: Succesful US Guided Aspiration & injection  =================================================================  Follow-up: Return in about 12 days (around 07/26/2017).   CMA/ATC served as Neurosurgeon during this visit. History, Physical, and Plan performed by medical provider. Documentation and orders reviewed and attested to.      Gaspar Bidding, DO    [facility] Phys[facility]

## 2017-07-26 ENCOUNTER — Ambulatory Visit (INDEPENDENT_AMBULATORY_CARE_PROVIDER_SITE_OTHER): Payer: Medicare Other | Admitting: Sports Medicine

## 2017-07-26 ENCOUNTER — Encounter: Payer: Self-pay | Admitting: Sports Medicine

## 2017-07-26 ENCOUNTER — Ambulatory Visit: Payer: Self-pay

## 2017-07-26 VITALS — BP 144/92 | HR 82 | Ht 66.0 in | Wt 211.0 lb

## 2017-07-26 DIAGNOSIS — M1711 Unilateral primary osteoarthritis, right knee: Secondary | ICD-10-CM

## 2017-07-26 NOTE — Assessment & Plan Note (Signed)
Orthovisc No. 3 of 3 performed today.  Small improvements in effusions with improved viscosity of fluid.  Anticipate continued relief.  Follow-up in 3 months for consideration of corticosteroid injections.  Patient aware total knee arthroplasty is the definitive treatment for this condition.

## 2017-07-26 NOTE — Progress Notes (Signed)
OFFICE VISIT NOTE Teresa Weiss. [name] at [facility] [phone]  Lashia Niese Denika Krone - 83 y.o. female MRN [id number]  Date of birth: 1933/07/26  Visit Date: 07/26/2017  PCP: Shirline Frees, NP   Referred by: Shirline Frees, NP  Orlie Dakin, CMA acting as scribe for Dr. Berline Chough.  SUBJECTIVE:   Chief Complaint  Patient presents with  . Follow-up    RT knee pain   HPI: As below and per problem based documentation when appropriate.  Teresa Weiss is an established patient presenting today in follow-up of RT knee pain. She was last seen 07/14/17 and received Orthovisc injection. 28 CC of synovial fluid was aspirated.   Pt reports minimal improvement in pain with Orthovisc injection. She has noticed some improvement in swelling. Pain is constant and is worse when walking or doing any type of activity.     Review of Systems  Constitutional: Negative for chills and fever.  Respiratory: Positive for shortness of breath and wheezing.   Cardiovascular: Negative for chest pain, palpitations and leg swelling.  Musculoskeletal: Positive for joint pain.  Neurological: Positive for tingling. Negative for dizziness and headaches.    Otherwise per HPI.  HISTORY & PERTINENT PRIOR DATA:  No specialty comments available. She reports that she quit smoking about 25 years ago. Her smoking use included Cigarettes. She smoked 1.00 pack per day. She has never used smokeless tobacco. No results for input(s): HGBA1C, LABURIC in the last 8760 hours. Allergies reviewed per EMR Prior to Admission medications   Medication Sig Start Date End Date Taking? Authorizing Provider  albuterol (PROVENTIL HFA;VENTOLIN HFA) 108 (90 Base) MCG/ACT inhaler Inhale 1-2 puffs into the lungs every 6 (six) hours as needed for wheezing or shortness of breath.   Yes [provider]  atenolol (TENORMIN) 25 MG tablet Take 1 tablet (25 mg total) by mouth at  bedtime. 10/07/16  Yes Nafziger, Kandee Keen, NP  budesonide-formoterol (SYMBICORT) 160-4.5 MCG/ACT inhaler Inhale 2 puffs into the lungs 3 (three) times daily.   Yes [provider]  cholecalciferol (VITAMIN D) 1000 units tablet Take 1,000 Units by mouth daily.   Yes [provider]  fluticasone (FLONASE) 50 MCG/ACT nasal spray Place 2 sprays into both nostrils daily. 02/01/17  Yes Leslye Peer, MD  furosemide (LASIX) 20 MG tablet Take 1 tablet (20 mg total) by mouth daily. 11/26/16  Yes Nafziger, Kandee Keen, NP  guaiFENesin (MUCINEX) 600 MG 12 hr tablet Take 2 tablets (1,200 mg total) by mouth 2 (two) times daily. 10/29/16  Yes Rolly Salter, MD  ibuprofen (ADVIL,MOTRIN) 200 MG tablet Take 200 mg by mouth every 6 (six) hours as needed for moderate pain.   Yes [provider]  ipratropium (ATROVENT) 0.03 % nasal spray Place 2 sprays into both nostrils every 12 (twelve) hours. 11/09/16  Yes Leslye Peer, MD  ipratropium-albuterol (DUONEB) 0.5-2.5 (3) MG/3ML SOLN Take 3 mLs by nebulization 4 (four) times daily. 10/29/16  Yes Rolly Salter, MD  levothyroxine (SYNTHROID, LEVOTHROID) 150 MCG tablet Take 1 tablet by mouth daily.   Yes [provider]  meloxicam (MOBIC) 15 MG tablet Take 15 mg by mouth daily.   Yes [provider]  mirtazapine (REMERON) 15 MG tablet Take 1 tablet (15 mg total) by mouth at bedtime. 11/26/16  Yes Nafziger, Kandee Keen, NP  Morphine Sulfate (MORPHINE CONCENTRATE) 10 MG/0.5ML SOLN concentrated solution Place 0.5 mLs (10 mg total) under the tongue every 4 (four)  hours as needed for severe pain, anxiety or shortness of breath (dyspnea/air hunger). 10/29/16  Yes Rolly Salter, MD  Multiple Vitamin (MULTIVITAMIN) tablet Take 1 tablet by mouth daily.   Yes [provider]  polyethylene glycol (MIRALAX / GLYCOLAX) packet Take 17 g by mouth 2 (two) times daily. 10/29/16  Yes Rolly Salter, MD  potassium chloride (K-DUR) 10 MEQ tablet Take 1  tablet (10 mEq total) by mouth daily. 02/07/17  Yes Nafziger, Kandee Keen, NP  pramipexole (MIRAPEX) 1.5 MG tablet Take 1 tablet (1.5 mg total) by mouth at bedtime. Take 1/2 tablet at noon and 1 tablet at bedtime 03/16/17  Yes Nafziger, Kandee Keen, NP  senna-docusate (SENOKOT-S) 8.6-50 MG tablet Take 2 tablets by mouth at bedtime. 10/29/16  Yes Rolly Salter, MD  sodium chloride (OCEAN) 0.65 % SOLN nasal spray Place 1 spray into both nostrils as needed for congestion. 10/29/16  Yes Rolly Salter, MD  tiotropium (SPIRIVA) 18 MCG inhalation capsule Place 18 mcg into inhaler and inhale daily.    Yes [provider]   Patient Active Problem List   Diagnosis Date Noted  . Primary osteoarthritis of right knee 03/24/2017  . Obstructive sleep apnea 11/09/2016  . Chronic vasomotor rhinitis 11/09/2016  . Hyperlipidemia   . Anxiety state   . Pain   . Palliative care by specialist   . Goals of care, counseling/discussion   . Encounter for hospice care discussion   . Chronic hypoxemic respiratory failure (HCC) 10/21/2016  . Chest tightness 10/21/2016  . Essential hypertension 10/21/2016  . Hypothyroidism 10/21/2016  . Depression 10/21/2016  . RLS (restless legs syndrome) 10/21/2016  . COPD (chronic obstructive pulmonary disease) (HCC) 10/13/2016   Past Medical History:  Diagnosis Date  . Chicken pox   . COPD (chronic obstructive pulmonary disease) (HCC)   . Depression   . Diverticulitis   . Essential hypertension   . Heart murmur   . Hyperlipidemia   . Hypothyroidism   . Oxygen dependent   . PAD (peripheral artery disease) (HCC)   . Psoriatic arthritis (HCC)   . Sleep apnea   . Urinary incontinence    Family History  Problem Relation Age of Onset  . Hyperlipidemia Father   . Hypertension Father   . Breast cancer Maternal Grandmother   . Heart attack Maternal Grandfather   . Heart disease Maternal Uncle    Past Surgical History:  Procedure Laterality Date  . ABDOMINAL HYSTERECTOMY   1977  . Appendectomy    . CATARACT EXTRACTION Bilateral   . COLON RESECTION    . INGUINAL HERNIA REPAIR    . Knee replacement  Left   . LUMBAR LAMINECTOMY    . VEIN LIGATION AND STRIPPING     Social History   Occupational History  . Not on file.   Social History Main Topics  . Smoking status: Former Smoker    Packs/day: 1.00    Types: Cigarettes    Quit date: 10/12/1991  . Smokeless tobacco: Never Used  . Alcohol use Yes     Comment:  socially  . Drug use: No  . Sexual activity: Not on file    OBJECTIVE:  VS:  HT:5\' 6"  (167.6 cm)   WT:211 lb (95.7 kg)  BMI:34.07    BP:(!) 144/92  HR:82bpm  TEMP: ( )  RESP:97 % EXAM: Findings:  Moderate effusion, valgus deformity, generalized osteoarthritic bossing.  No significant overlying skin changes.Marland Kitchen    RADIOLOGY: US GUIDED NEEDLE PLACEMENT(NO LINKED CHARGES)  Andrena Mews, DO     07/26/2017 10:05 AM PROCEDURE NOTE - ULTRASOUND GUIDED ASPIRATION & INJECTION: Right  Knee Orthovisc #3/3 Images were obtained and interpreted by myself, Gaspar Bidding, DO   Images have been saved and stored to PACS system. Images obtained on: GE S7 Ultrasound machine  ULTRASOUND FINDINGS: Moderate effusion  DESCRIPTION OF PROCEDURE:  The patient's clinical condition is marked by substantial pain  and/or significant functional disability. Other conservative  therapy has not provided relief, is contraindicated, or not  appropriate. There is a reasonable likelihood that injection will  significantly improve the patient's pain and/or functional  impairment. After discussing the risks, benefits and expected  outcomes of the injection and all questions were reviewed and  answered, the patient wished to undergo the above named  procedure. Verbal consent was obtained. The ultrasound was used  to identify the target structure and adjacent neurovascular  structures. The skin was then prepped in sterile fashion and the  target structure was  injected under direct visualization using  sterile technique as below: PREP: Alcohol, Ethel Chloride, 5cc 1% lidocaine on 25 needle APPROACH: Superiolateral, stopcock technique, 18g 1.5" needle INJECTATE: 2cc ( ) Orthovisc ASPIRATE: 26cc serous fluid  DRESSING: Band-Aid & Pts own body helix  Post procedural instructions including recommending icing and  warning signs for infection were reviewed. This procedure was  well tolerated and there were no complications.   IMPRESSION: Succesful US Guided Aspiration & injection  ASSESSMENT & PLAN:     ICD-10-CM   1. Primary osteoarthritis of right knee M17.11 US GUIDED NEEDLE PLACEMENT(NO LINKED CHARGES)   ================================================================= Primary osteoarthritis of right knee Orthovisc No. 3 of 3 performed today.  Small improvements in effusions with improved viscosity of fluid.  Anticipate continued relief.  Follow-up in 3 months for consideration of corticosteroid injections.  Patient aware total knee arthroplasty is the definitive treatment for this condition.  PROCEDURE NOTE - ULTRASOUND GUIDED ASPIRATION & INJECTION: Right Knee Orthovisc #3/3 Images were obtained and interpreted by myself, Gaspar Bidding, DO  Images have been saved and stored to PACS system. Images obtained on: GE S7 Ultrasound machine  ULTRASOUND FINDINGS: Moderate effusion  DESCRIPTION OF PROCEDURE:  The patient's clinical condition is marked by substantial pain and/or significant functional disability. Other conservative therapy has not provided relief, is contraindicated, or not appropriate. There is a reasonable likelihood that injection will significantly improve the patient's pain and/or functional impairment. After discussing the risks, benefits and expected outcomes of the injection and all questions were reviewed and answered, the patient wished to undergo the above named procedure. Verbal consent was obtained. The  ultrasound was used to identify the target structure and adjacent neurovascular structures. The skin was then prepped in sterile fashion and the target structure was injected under direct visualization using sterile technique as below: PREP: Alcohol, Ethel Chloride, 5cc 1% lidocaine on 25 needle APPROACH: Superiolateral, stopcock technique, 18g 1.5" needle INJECTATE: 2cc ( ) Orthovisc ASPIRATE: 26cc serous fluid  DRESSING: Band-Aid & Pts own body helix  Post procedural instructions including recommending icing and warning signs for infection were reviewed. This procedure was well tolerated and there were no complications.   IMPRESSION: Succesful US Guided Aspiration & injection  ================================================================= Follow-up: Return in about 3 months (around 10/26/2017).   CMA/ATC served as Neurosurgeon during this visit. History, Physical, and Plan performed by medical provider. Documentation and orders reviewed and attested to.      Gaspar Bidding, DO    [facility]

## 2017-07-26 NOTE — Patient Instructions (Signed)

## 2017-07-26 NOTE — Procedures (Signed)
PROCEDURE NOTE - ULTRASOUND GUIDED ASPIRATION & INJECTION: Right Knee Orthovisc #3/3 Images were obtained and interpreted by myself, Gaspar Bidding, DO  Images have been saved and stored to PACS system. Images obtained on: GE S7 Ultrasound machine  ULTRASOUND FINDINGS: Moderate effusion  DESCRIPTION OF PROCEDURE:  The patient's clinical condition is marked by substantial pain and/or significant functional disability. Other conservative therapy has not provided relief, is contraindicated, or not appropriate. There is a reasonable likelihood that injection will significantly improve the patient's pain and/or functional impairment. After discussing the risks, benefits and expected outcomes of the injection and all questions were reviewed and answered, the patient wished to undergo the above named procedure. Verbal consent was obtained. The ultrasound was used to identify the target structure and adjacent neurovascular structures. The skin was then prepped in sterile fashion and the target structure was injected under direct visualization using sterile technique as below: PREP: Alcohol, Ethel Chloride, 5cc 1% lidocaine on 25 needle APPROACH: Superiolateral, stopcock technique, 18g 1.5" needle INJECTATE: 2cc ( ) Orthovisc ASPIRATE: 26cc serous fluid  DRESSING: Band-Aid & Pts own body helix  Post procedural instructions including recommending icing and warning signs for infection were reviewed. This procedure was well tolerated and there were no complications.   IMPRESSION: Succesful US Guided Aspiration & injection

## 2017-08-03 ENCOUNTER — Other Ambulatory Visit: Payer: Self-pay | Admitting: Adult Health

## 2017-08-03 ENCOUNTER — Telehealth: Payer: Self-pay | Admitting: Adult Health

## 2017-08-03 MED ORDER — BUDESONIDE-FORMOTEROL FUMARATE 160-4.5 MCG/ACT IN AERO: 2.0000 | INHALATION_SPRAY | Freq: Three times a day (TID) | RESPIRATORY_TRACT | 6 refills | Status: DC

## 2017-08-03 NOTE — Telephone Encounter (Signed)
Pt states she is having a hard time getting [facility] for her  budesonide-formoterol (SYMBICORT) 160-4.5 MCG/ACT inhaler Pt wants to know if cory will refill for her.  [facility] / [phone]  Pt states she is out and needs to know something asap. Pt very upset not being able to get this refilled from someone Pt would like a call back either way

## 2017-08-03 NOTE — Telephone Encounter (Signed)
Sent to pharmacy 

## 2017-08-04 MED ORDER — BUDESONIDE-FORMOTEROL FUMARATE 160-4.5 MCG/ACT IN AERO: 2.0000 | INHALATION_SPRAY | Freq: Three times a day (TID) | RESPIRATORY_TRACT | 6 refills | Status: DC

## 2017-08-04 NOTE — Telephone Encounter (Signed)
Sent to [facility].

## 2017-08-04 NOTE — Telephone Encounter (Signed)
Left a message on identified voicemail informing the pt to pick up rx at the pharmacy. 

## 2017-08-04 NOTE — Telephone Encounter (Signed)
Pt states she has no way to pick up Rx and wants to know if you will resend to  [facility]- [place] - [address]  Pt states they can deliver  . budesonide-formoterol (SYMBICORT) 160-4.5 MCG/ACT inhaler  Thank you!

## 2017-08-04 NOTE — Addendum Note (Signed)
Addended by: Raj JanusADKINS, Rillie Riffel T on: 08/04/2017 02:31 PM   Modules accepted: Orders

## 2017-08-08 ENCOUNTER — Telehealth: Payer: Self-pay

## 2017-08-08 NOTE — Telephone Encounter (Signed)
Received PA for Symbicort. PA submitted & pending. Key: [id number]

## 2017-08-09 MED ORDER — BUDESONIDE-FORMOTEROL FUMARATE 160-4.5 MCG/ACT IN AERO: 2.0000 | INHALATION_SPRAY | Freq: Two times a day (BID) | RESPIRATORY_TRACT | 6 refills | Status: DC

## 2017-08-09 NOTE — Telephone Encounter (Signed)
yes

## 2017-08-09 NOTE — Telephone Encounter (Signed)
Insurance will not pay for #6 per day (2 puffs x 3 times) but will pay for #4 per day (2 puffs x 2 times.)

## 2017-08-09 NOTE — Telephone Encounter (Signed)
Ok to change sig?

## 2017-08-09 NOTE — Telephone Encounter (Signed)
Sent to the pharmacy by e-scribe. 

## 2017-08-09 NOTE — Addendum Note (Signed)
Addended by: Raj JanusADKINS, Arnet Hofferber T on: 08/09/2017 03:47 PM   Modules accepted: Orders

## 2017-09-08 ENCOUNTER — Encounter: Payer: Self-pay | Admitting: Emergency Medicine

## 2017-09-08 ENCOUNTER — Ambulatory Visit: Payer: Medicare Other | Admitting: Emergency Medicine

## 2017-09-08 DIAGNOSIS — J9611 Chronic respiratory failure with hypoxia: Secondary | ICD-10-CM | POA: Diagnosis not present

## 2017-09-08 DIAGNOSIS — G4733 Obstructive sleep apnea (adult) (pediatric): Secondary | ICD-10-CM

## 2017-09-08 DIAGNOSIS — J449 Chronic obstructive pulmonary disease, unspecified: Secondary | ICD-10-CM

## 2017-09-08 NOTE — Assessment & Plan Note (Signed)
Tolerating Spiriva, Symbicort.  Uses albuterol nebs but infrequently.  No exacerbations.  Flu shot up-to-date.  We will continue this same regimen.

## 2017-09-08 NOTE — Assessment & Plan Note (Signed)
Altered factorial in the setting of restrictive lung disease, hypoventilation, COPD.  She is stable at 3 L/min at rest.  Up titrate this as needed to 6 L/min with exertion.  Continue this regimen.

## 2017-09-08 NOTE — Progress Notes (Signed)
Subjective:    Patient ID: Teresa Weiss, female    DOB: 07-12-1933, 83 y.o.   MRN: [id number]  HPI  ROV 06/07/17 -- patient has a history of psoriatic arthritis, severe COPD, OSA, chronic rhinitis, chronic hypoxemic respiratory failure. She has been managed on Symbicort and Spiriva. She uses oxygen 3-6 L/m. Since our last visit she got a new CPAP machine - she tells me that she has had increased nocturnal awakenings, has waked up and documented desaturations. She is having more leakage - she believes that this may be the problem. Full face mask. She has some days when she cannot do much - very wiped out. unclear that this correlates with her sleep quality vs COPD vs edema   ROV 09/08/17 --83 year old woman with history of severe COPD, obstructive sleep apnea on CPAP, chronic rhinitis, chronic hypoxemic respiratory failure on oxygen 3-6 L/min. Last time we worked on getting her a smaller CPAP mask. She feels that the leakage is better. She raises a concern about whether she can keep the new mask clean enough. I also changed her to auto-set mode, and she is tolerating. She is well-compliant, feels well rested in the am. Breathing has been good, occasionally a bad day - ? Related to diet or salt intake. Has waxing / waning edema.    Review of Systems  Constitutional: Negative.  Negative for fever and unexpected weight change.  HENT: Positive for postnasal drip and rhinorrhea. Negative for congestion, dental problem, ear pain, nosebleeds, sinus pressure, sneezing, sore throat and trouble swallowing.   Eyes: Negative.  Negative for redness and itching.  Respiratory: Positive for shortness of breath. Negative for cough, chest tightness and wheezing.   Cardiovascular: Positive for leg swelling. Negative for palpitations.  Gastrointestinal: Negative.  Negative for nausea and vomiting.  Genitourinary: Negative.  Negative for dysuria.  Musculoskeletal: Negative.  Negative for joint  swelling.  Skin: Negative.  Negative for rash.  Neurological: Negative.  Negative for headaches.  Hematological: Bruises/bleeds easily.  Psychiatric/Behavioral: Negative.  Negative for dysphoric mood. The patient is not nervous/anxious.     Past Medical History:  Diagnosis Date  . Chicken pox   . COPD (chronic obstructive pulmonary disease) (HCC)   . Depression   . Diverticulitis   . Essential hypertension   . Heart murmur   . Hyperlipidemia   . Hypothyroidism   . Oxygen dependent   . PAD (peripheral artery disease) (HCC)   . Psoriatic arthritis (HCC)   . Sleep apnea   . Urinary incontinence      Family History  Problem Relation Age of Onset  . Hyperlipidemia Father   . Hypertension Father   . Breast cancer Maternal Grandmother   . Heart attack Maternal Grandfather   . Heart disease Maternal Uncle      Social History   Socioeconomic History  . Marital status: Legally Separated    Spouse name: Not on file  . Number of children: Not on file  . Years of education: Not on file  . Highest education level: Not on file  Social Needs  . Financial resource strain: Not on file  . Food insecurity - worry: Not on file  . Food insecurity - inability: Not on file  . Transportation needs - medical: Not on file  . Transportation needs - non-medical: Not on file  Occupational History  . Not on file  Tobacco Use  . Smoking status: Former Smoker    Packs/day: 1.00    Types:  Cigarettes    Last attempt to quit: 10/12/1991    Years since quitting: 25.9  . Smokeless tobacco: Never Used  Substance and Sexual Activity  . Alcohol use: Yes    Comment:  socially  . Drug use: No  . Sexual activity: Not on file  Other Topics Concern  . Not on file  Social History Narrative   Sepeated    Worked as Radiology tech    Has a son and daughter. Son lives in [place] and works at [employer].    she has lived in [place], [place], [place], [place],  She has worked Engineer, structuralradiology tech for many years Was in the [employer] >  worked Occupational hygienistcommunications  Allergies  Allergen Reactions  . Infliximab Shortness Of Breath    Respiratory complications.  . Vicodin [Hydrocodone-Acetaminophen] Nausea And Vomiting     Outpatient Medications Prior to Visit  Medication Sig Dispense Refill  . acetaminophen (TYLENOL) 500 MG tablet Take 500 mg by mouth as needed.    Marland Kitchen. albuterol (PROVENTIL HFA;VENTOLIN HFA) 108 (90 Base) MCG/ACT inhaler Inhale 1-2 puffs into the lungs every 6 (six) hours as needed for wheezing or shortness of breath.    Marland Kitchen. atenolol (TENORMIN) 25 MG tablet Take 1 tablet (25 mg total) by mouth at bedtime. 90 tablet 3  . budesonide-formoterol (SYMBICORT) 160-4.5 MCG/ACT inhaler Inhale 2 puffs into the lungs 3 (three) times daily.    . cholecalciferol (VITAMIN D) 1000 units tablet Take 1,000 Units by mouth daily.    . fluticasone (FLONASE) 50 MCG/ACT nasal spray Place 2 sprays into both nostrils daily. 16 g 3  . furosemide (LASIX) 20 MG tablet Take 1 tablet (20 mg total) by mouth daily. 90 tablet 1  . guaiFENesin (MUCINEX) 600 MG 12 hr tablet Take 2 tablets (1,200 mg total) by mouth 2 (two) times daily. 60 tablet 0  . ipratropium-albuterol (DUONEB) 0.5-2.5 (3) MG/3ML SOLN Take 3 mLs by nebulization 4 (four) times daily. 360 mL 0  . levothyroxine (SYNTHROID, LEVOTHROID) 150 MCG tablet Take 1 tablet by mouth daily.    . meloxicam (MOBIC) 15 MG tablet Take 15 mg by mouth daily.    . mirtazapine (REMERON) 15 MG tablet Take 1 tablet (15 mg total) by mouth at bedtime. 90 tablet 1  . Morphine Sulfate (MORPHINE CONCENTRATE) 10 MG/0.5ML SOLN concentrated solution Place 0.5 mLs (10 mg total) under the tongue every 4 (four) hours as needed for severe pain, anxiety or shortness of breath (dyspnea/air hunger). 30 mL 0  . Multiple Vitamin (MULTIVITAMIN) tablet Take 1 tablet by mouth daily.    . polyethylene glycol (MIRALAX / GLYCOLAX) packet Take 17 g by mouth 2 (two) times daily. 14 each 0  . potassium chloride (K-DUR) 10 MEQ tablet  Take 1 tablet (10 mEq total) by mouth daily. 90 tablet 2  . pramipexole (MIRAPEX) 1.5 MG tablet Take 1 tablet (1.5 mg total) by mouth at bedtime. Take 1/2 tablet at noon and 1 tablet at bedtime 90 tablet 0  . senna-docusate (SENOKOT-S) 8.6-50 MG tablet Take 2 tablets by mouth at bedtime. 30 tablet 0  . sodium chloride (OCEAN) 0.65 % SOLN nasal spray Place 1 spray into both nostrils as needed for congestion. 1 Bottle 0  . tiotropium (SPIRIVA) 18 MCG inhalation capsule Place 18 mcg into inhaler and inhale daily.     . budesonide-formoterol (SYMBICORT) 160-4.5 MCG/ACT inhaler Inhale 2 puffs into the lungs 2 (two) times daily. 1 Inhaler 6  . ibuprofen (ADVIL,MOTRIN) 200 MG tablet Take 200  mg by mouth every 6 (six) hours as needed for moderate pain.    Marland Kitchen. ipratropium (ATROVENT) 0.03 % nasal spray Place 2 sprays into both nostrils every 12 (twelve) hours. 30 mL 6   No facility-administered medications prior to visit.         Objective:   Physical Exam Vitals:   09/08/17 0918 09/08/17 0919  BP:  (!) 150/92  Pulse:  69  SpO2:  99%  Weight: 210 lb (95.3 kg)   Height: 5\' 6"  (1.676 m)    Gen: Pleasant, overwt, in no distress,  normal affect  ENT: No lesions,  mouth clear,  oropharynx clear, no postnasal drip  Neck: No JVD, no stridor  Lungs: No use of accessory muscles, distant, wheeze only a forced expiration  Cardiovascular: RRR, heart sounds normal, no murmur or gallops, 1+ pretibial edema B  Musculoskeletal: No deformities, no cyanosis or clubbing  Neuro: alert, non focal  Skin: Warm, no lesions or rashes     Assessment & Plan:  COPD (chronic obstructive pulmonary disease) (HCC) Tolerating Spiriva, Symbicort.  Uses albuterol nebs but infrequently.  No exacerbations.  Flu shot up-to-date.  We will continue this same regimen.  Chronic hypoxemic respiratory failure (HCC) Altered factorial in the setting of restrictive lung disease, hypoventilation, COPD.  She is stable at 3 L/min  at rest.  Up titrate this as needed to 6 L/min with exertion.  Continue this regimen.  Obstructive sleep apnea She appears to have benefited from the change to an AutoSet pressure mode and also a smaller full facemask.  She is having less leakage.  She sometimes goes back and forth between the small size and the large size mask.  She also wonders about whether she is keeping the new mask clean enough.  I reassured her about this.  Levy Pupaobert Dsean Vantol, MD, PhD 09/08/2017, 9:43 AM [facility] [phone] or if no answer [phone]

## 2017-09-08 NOTE — Assessment & Plan Note (Signed)
She appears to have benefited from the change to an AutoSet pressure mode and also a smaller full facemask.  She is having less leakage.  She sometimes goes back and forth between the small size and the large size mask.  She also wonders about whether she is keeping the new mask clean enough.  I reassured her about this.

## 2017-09-08 NOTE — Patient Instructions (Signed)
Please continue to your CPAP every night as you are using it wear your oxygen at 3-6 L/min at all times Continue Spiriva and Symbicort on your current schedule Use albuterol nebulized if needed for shortness of breath, wheezing, chest tightness Flu shot up-to-date Follow with Dr Delton CoombesByrum in 6 months or sooner if you have any problems

## 2017-10-24 ENCOUNTER — Encounter: Payer: Self-pay | Admitting: Sports Medicine

## 2017-10-25 ENCOUNTER — Encounter: Payer: Self-pay | Admitting: Sports Medicine

## 2017-10-25 ENCOUNTER — Ambulatory Visit: Payer: Medicare Other | Admitting: Sports Medicine

## 2017-10-25 ENCOUNTER — Ambulatory Visit: Payer: Self-pay

## 2017-10-25 VITALS — BP 136/94 | HR 69 | Ht 66.0 in | Wt 207.2 lb

## 2017-10-25 DIAGNOSIS — M1711 Unilateral primary osteoarthritis, right knee: Secondary | ICD-10-CM | POA: Diagnosis not present

## 2017-10-25 NOTE — Patient Instructions (Signed)
Zilretta is the long acting steroid I was telling you about.  You can also look into RFA (Radio frequency ablation) for the knee. [email or web address]://[email or web address]://[email or web address]/find-[email or web address]-coolief-[email or web address]/  You had an injection today.  Things to be aware of after injection are listed below: . You may experience no significant improvement or even a slight worsening in your symptoms during the first 24 to 48 hours.  After that we expect your symptoms to improve gradually over the next 2 weeks for the medicine to have its maximal effect.  You should continue to have improvement out to 6 weeks after your injection. . Dr. Berline Choughigby recommends icing the site of the injection for 20 minutes  1-2 times the day of your injection . You may shower but no swimming, tub bath or Jacuzzi for 24 hours. . If your bandage falls off this does not need to be replaced.  It is appropriate to remove the bandage after 4 hours. . You may resume light activities as tolerated unless otherwise directed per Dr. Berline Choughigby during your visit  POSSIBLE STEROID SIDE EFFECTS:  Side effects from injectable steroids tend to be less than when taken orally however you may experience some of the symptoms listed below.  If experienced these should only last for a short period of time. Change in menstrual flow  Edema (swelling)  Increased appetite Skin flushing (redness)  Skin rash/acne  Thrush (oral) Yeast vaginitis    Increased sweating  Depression Increased blood glucose levels Cramping and leg/calf  Euphoria (feeling happy)  POSSIBLE PROCEDURE SIDE EFFECTS: The side effects of the injection are usually fairly minimal however if you may experience some of the following side effects that are usually self-limited and will is off on their own.  If you are concerned please feel free to call the office with questions:  Increased numbness or tingling  Nausea or vomiting  Swelling or bruising at the injection site   Please call our office if if you experience any of  the following symptoms over the next week as these can be signs of infection:   Fever greater than 100.66F  Significant swelling at the injection site  Significant redness or drainage from the injection site  If after 2 weeks you are continuing to have worsening symptoms please call our office to discuss what the next appropriate actions should be including the potential for a return office visit or other diagnostic testing.

## 2017-10-25 NOTE — Procedures (Signed)
PROCEDURE NOTE:  Aspiration and Injection: Right knee Images were obtained and interpreted by myself, Gaspar BiddingMichael Rigby, DO  Images have been saved and stored to PACS system. Images obtained on: GE S7 Ultrasound machine  ULTRASOUND FINDINGS:  Large effusion, generalized synovitis with significant tricompartmental degenerative changes.  DESCRIPTION OF PROCEDURE:  The patient's clinical condition is marked by substantial pain and/or significant functional disability. Other conservative therapy has not provided relief, is contraindicated, or not appropriate. There is a reasonable likelihood that injection will significantly improve the patient's pain and/or functional impairment.  After discussing the risks, benefits and expected outcomes of the injection and all questions were reviewed and answered, the patient wished to undergo the above named procedure. Verbal consent was obtained.  The ultrasound was used to identify the target structure and adjacent neurovascular structures. The skin was then prepped in sterile fashion and the target structure was injected under direct visualization using sterile technique as below:   PREP: Alcohol, Ethel Chloride, 5cc 1% lidocaine on 25g 1.5 in. Needle APPROACH: superiolateral, stopcock technique, 18g 1.5in. INJECTATE: 2cc 0.5% marcaine, 2cc 40mg /mL DepoMedrol   ASPIRATE: 25cc thick serous straw colored fluid   DRESSING: Band-Aid and: 6-inch Ace Wrap   Post procedural instructions including recommending icing and warning signs for infection were reviewed.  This procedure was well tolerated and there were no complications.   IMPRESSION: Succesful Aspiration and Injection

## 2017-10-25 NOTE — Progress Notes (Signed)
Teresa Weiss. Shavanna Furnari, [facility]  Charline Hoskinson Keisa Blow - 84 y.o. female MRN [id number]  Date of birth: 06-29-1933  Visit Date: 10/25/2017  PCP: Dorothyann Peng, NP   Referred by: Dorothyann Peng, NP   Scribe for today's visit: Josepha Pigg, CMA     SUBJECTIVE:  Teresa Weiss is here for Follow-up (osteoarthritis RT knee)  Compared to the last office visit, her previously described symptoms show no change, she got little relief from Orthovisc injections. She hasn't noticed any swelling around the knee since her last visit.  Current symptoms are moderate-severe & are nonradiating She has been using her electric scooter to get around now instead of her walker because her knee has been hurting so much. She has been taking Meloxicam with no relief. 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    ROS Denies night time disturbances. Denies fevers, chills, or night sweats. Denies unexplained weight loss. Denies changes in bowel or bladder habits. Denies recent unreported falls. Denies new or worsening dyspnea or wheezing. Denies headaches or dizziness.  Reports numbness, tingling or weakness in the extremities.  Denies dizziness or presyncopal episodes Denies lower extremity edema     HISTORY & PERTINENT PRIOR DATA:  Prior History reviewed and updated per electronic medical record.  Significant history, findings, studies and interim changes include:  reports that she quit smoking about 26 years ago. Her smoking use included cigarettes. She smoked 1.00 pack per day. she has never used smokeless tobacco. No results for input(s): HGBA1C, LABURIC, CREATINE in the last 8760 hours. 11/03/17 - Zilretta covered 80/20 with a $50 OV copay. After OOP max has been met insurance will cover at 100% - [facility] No problems updated.  OBJECTIVE:  VS:  HT:'5\' 6"'   (167.6 cm)   WT:207 lb 3.2 oz (94 kg)  BMI:33.46    BP:(!) 136/94  HR:69bpm  TEMP: ( )  RESP:97 %   PHYSICAL EXAM: Constitutional: WDWN, Non-toxic appearing. Psychiatric: Alert & appropriately interactive.  Not depressed or anxious appearing. Respiratory: Wearing oxygen, no acute respiratory distress Trachea Midline Eyes: Pupils are equal.  EOM intact without nystagmus.  No scleral icterus  NEUROVASCULAR exam: No clubbing or cyanosis appreciated No significant venous stasis changes Capillary Refill: normal, less than 2 seconds   Right knee with general testing 3 bossing and a moderate effusion.  Ligamentously stable.  Range of motion from 3 degrees to 115 degrees.  No additional findings.   ASSESSMENT & PLAN:   1. Primary osteoarthritis of right knee    PLAN: Corticosteroid aspiration injection performed today per procedure note.  Continue with compression and therapeutic exercises.  Follow-up as needed  No problem-specific Assessment & Plan notes found for this encounter.   ++++++++++++++++++++++++++++++++++++++++++++ Orders & Meds: Orders Placed This Encounter  Procedures  . US GUIDED NEEDLE PLACEMENT(NO LINKED CHARGES)    No orders of the defined types were placed in this encounter.   ++++++++++++++++++++++++++++++++++++++++++++ Follow-up: Return if symptoms worsen or fail to improve.   Pertinent documentation may be included in additional procedure notes, imaging studies, problem based documentation and patient instructions. Please see these sections of the encounter for additional information regarding this visit. CMA/ATC served as Education administrator during this visit. History, Physical, and Plan performed by medical provider. Documentation and orders reviewed and attested to.      Gerda Diss, DO    [facility] Physician

## 2017-10-31 ENCOUNTER — Telehealth: Payer: Self-pay | Admitting: Adult Health

## 2017-10-31 NOTE — Telephone Encounter (Signed)
Spoke with pt and she c/o urinary frequency and burning. She has started Azo and cranberry juice. Symptoms started yesterday. She denies any abd pain, n/v/f. She states that she has no transportation to get to an OV.  Cory - Please advise. Thanks.

## 2017-10-31 NOTE — Telephone Encounter (Signed)
Copied from CRM [id number]. Topic: Quick Communication - See Telephone Encounter >> Oct 31, 2017  2:00 PM Terisa Starraylor, Brittany L wrote: CRM for notification. See Telephone encounter for:   10/31/17.  Patient said she knows she has a bladder infection. She said she has no transportation & on oxygen. She wants an antibiotic. Call back number is [phone] Advised her she most likely will need to be seen. She said she has no way to come   [facility] [address]

## 2017-11-01 MED ORDER — SULFAMETHOXAZOLE-TRIMETHOPRIM 800-160 MG PO TABS: 1.0000 | ORAL_TABLET | Freq: Two times a day (BID) | ORAL | 0 refills | Status: AC

## 2017-11-01 NOTE — Telephone Encounter (Signed)
Please send rx to [facility]  Pt called to check on status of request

## 2017-11-01 NOTE — Telephone Encounter (Signed)
Spoke with pt and advised on rx to pharmacy. She will call office for OV if not improved after abx. Nothing further needed at this time.

## 2017-11-01 NOTE — Telephone Encounter (Signed)
Ok to send in Bactrim BID x 3 days   If this does not clear she will need to come in for office visit

## 2017-11-09 ENCOUNTER — Encounter: Payer: Self-pay | Admitting: Sports Medicine

## 2018-01-26 IMAGING — DX DG CHEST 2V
2 series · 2 of 2 positions shown · non-contrast
Comparison: None.

CLINICAL DATA: Increasing shortness of breath for 3 days. History
of COPD.

EXAM:
CHEST  2 VIEW

[chest pa]
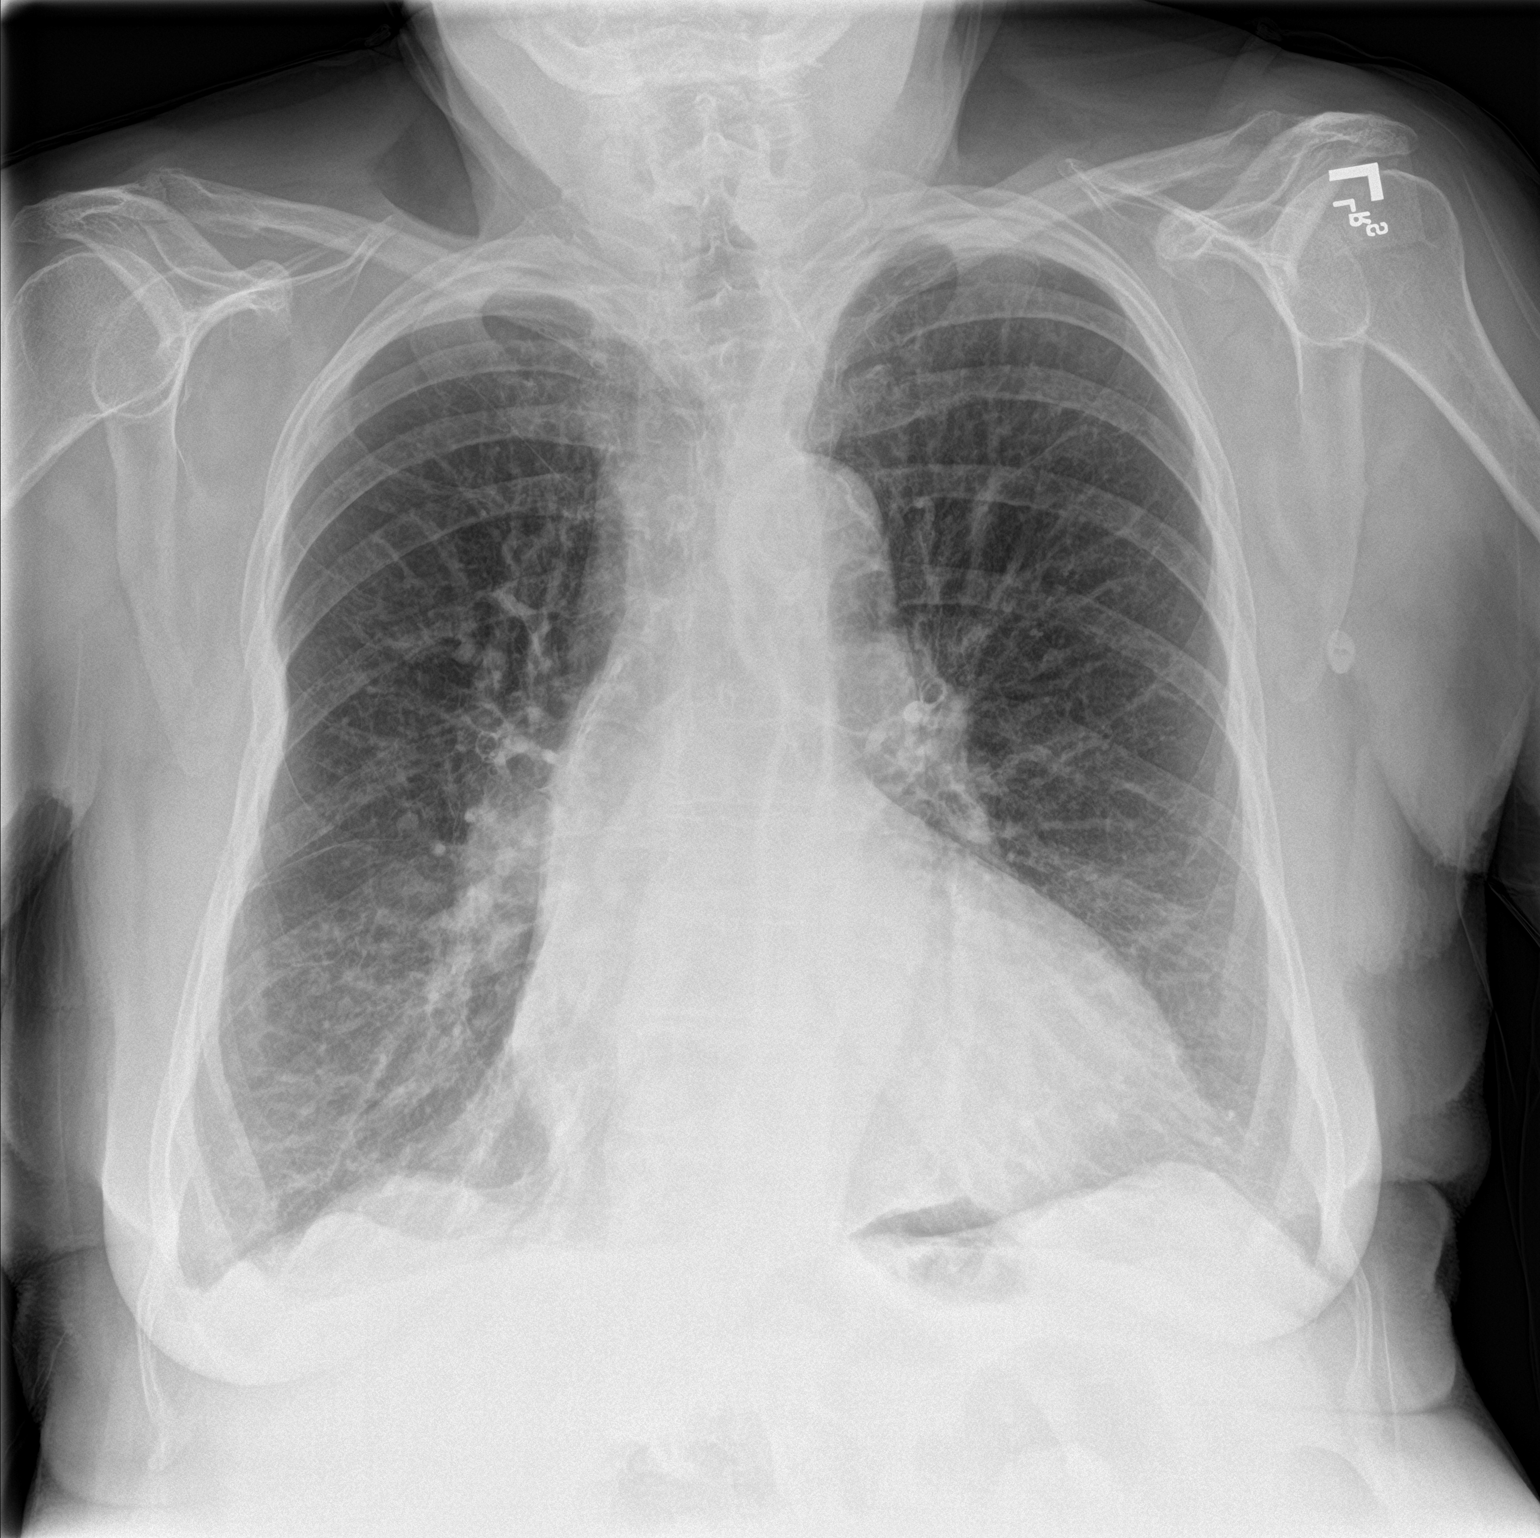

[chest lat]
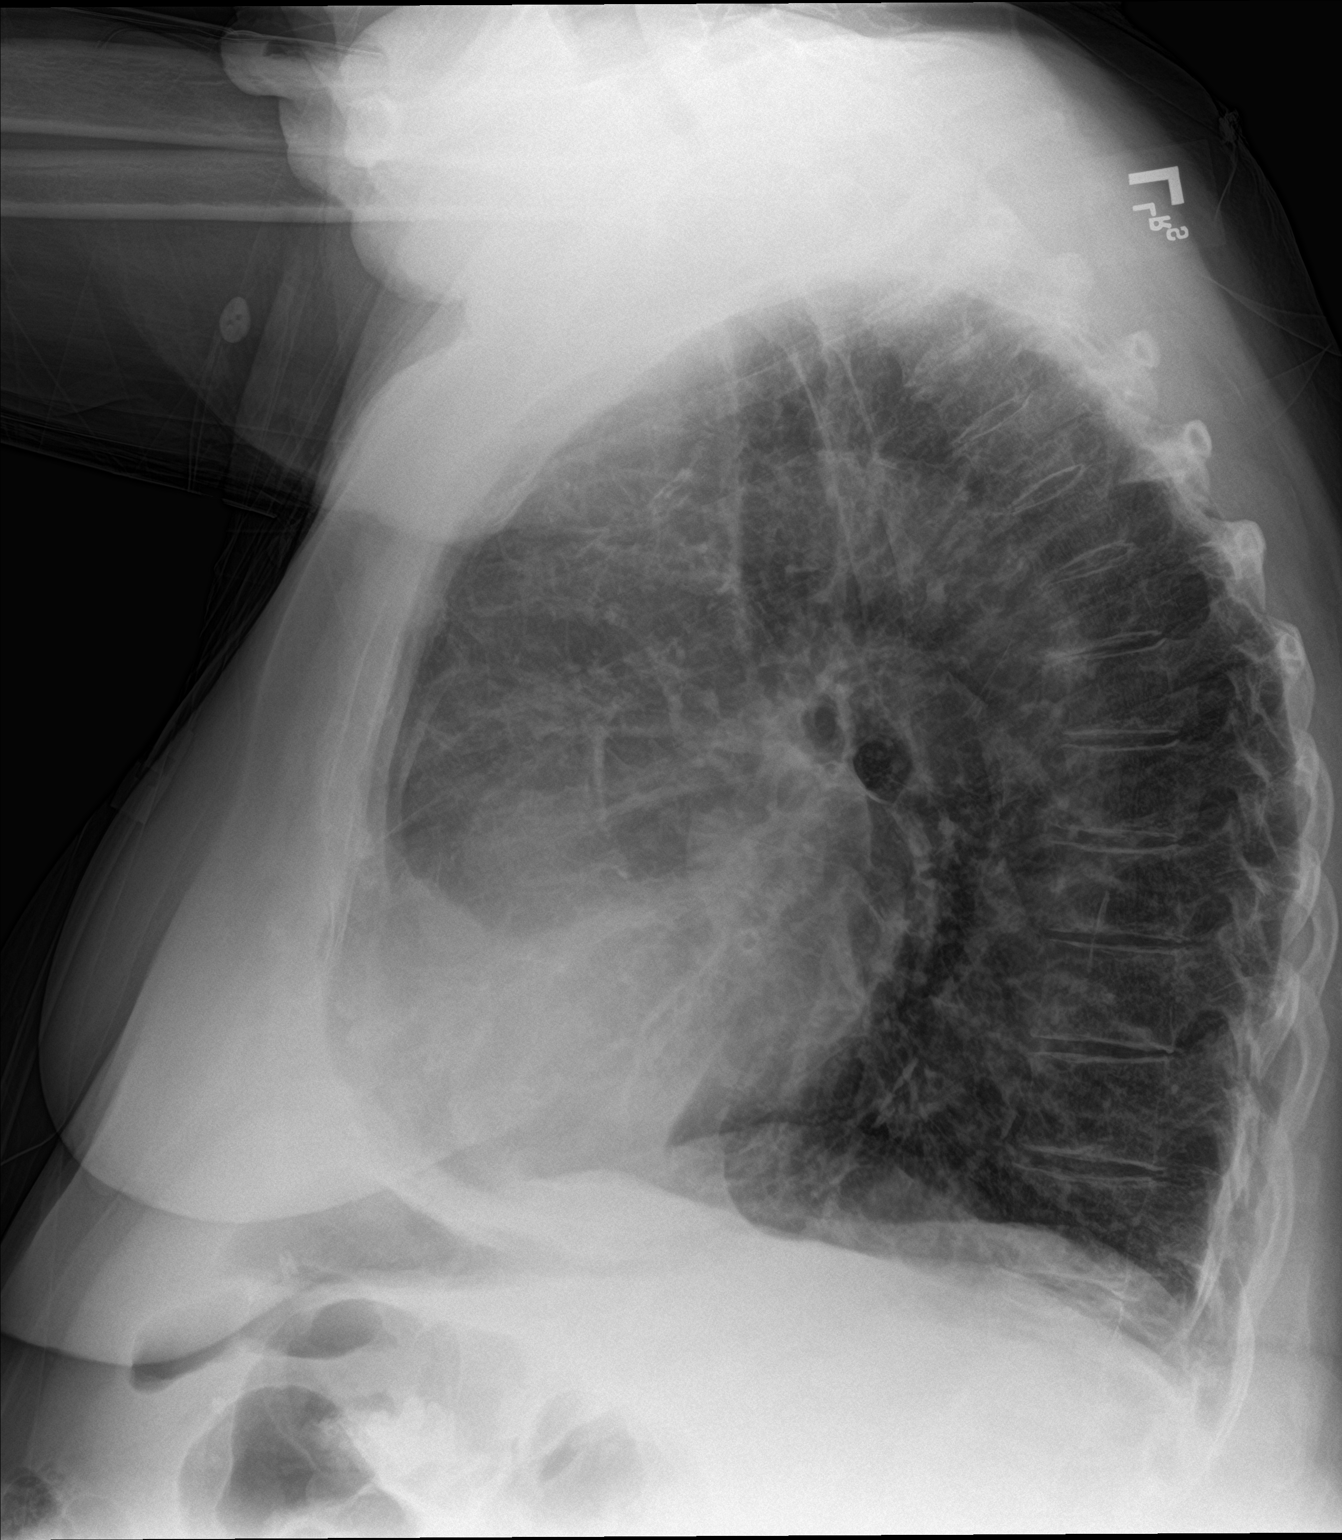

[2 of 2 positions shown; findings below may reference images not displayed]

FINDINGS: Mild cardiac enlargement with normal pulmonary vascularity.
Emphysematous changes and scattered fibrosis in the lungs. No focal
airspace disease or consolidation. No blunting of costophrenic
angles. No pneumothorax. Calcified and tortuous aorta. Degenerative
changes in the spine.
IMPRESSION: Cardiac enlargement. Emphysematous changes in the lungs. No evidence
of active pulmonary disease.

## 2018-06-29 IMAGING — DX DG KNEE 1-2V*R*
2 series · 2 of 2 positions shown · non-contrast
Comparison: None.

CLINICAL DATA: Chronic right knee pain, no injury

EXAM:
RIGHT KNEE - 1-2 VIEW

[knee standing ap]
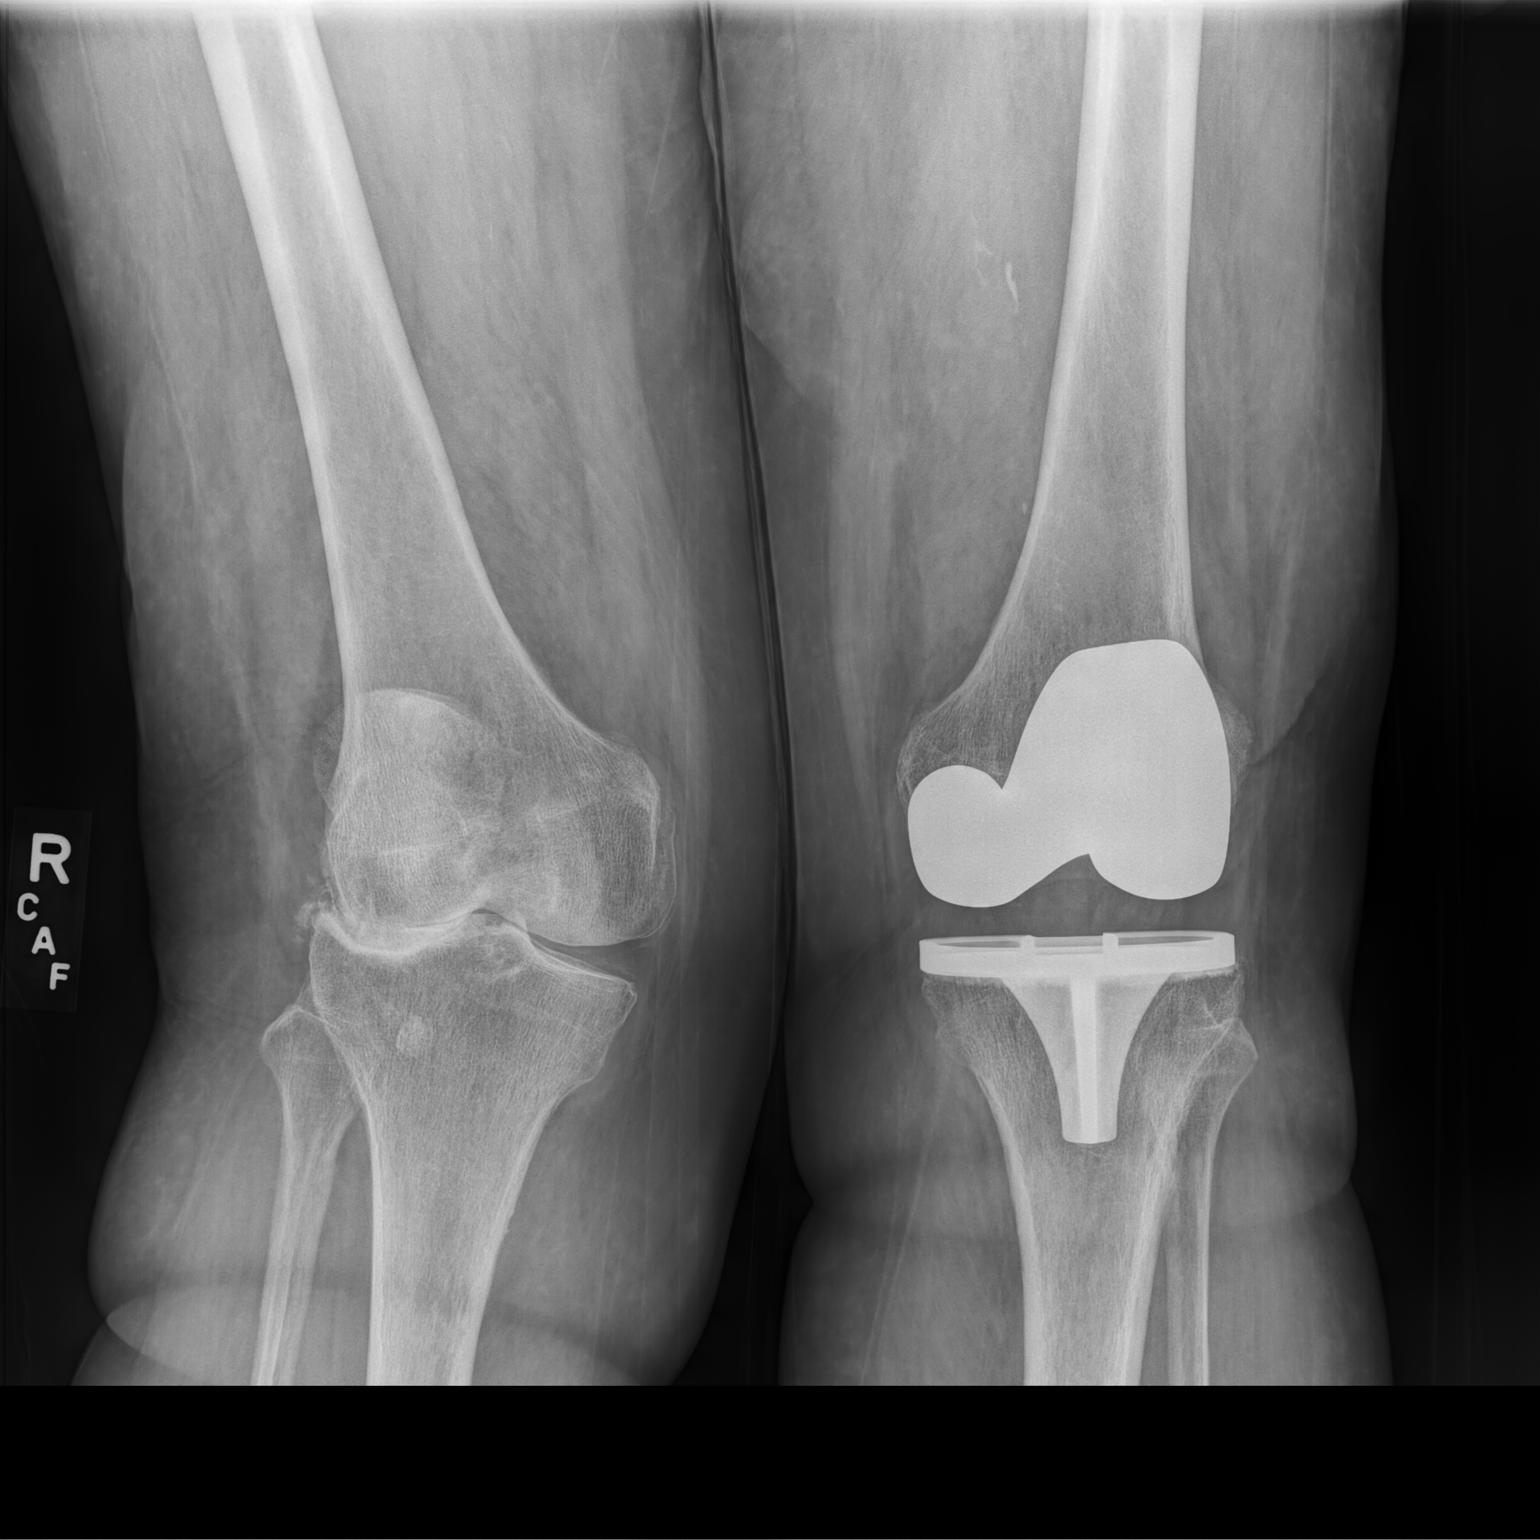

[knee standing lat]
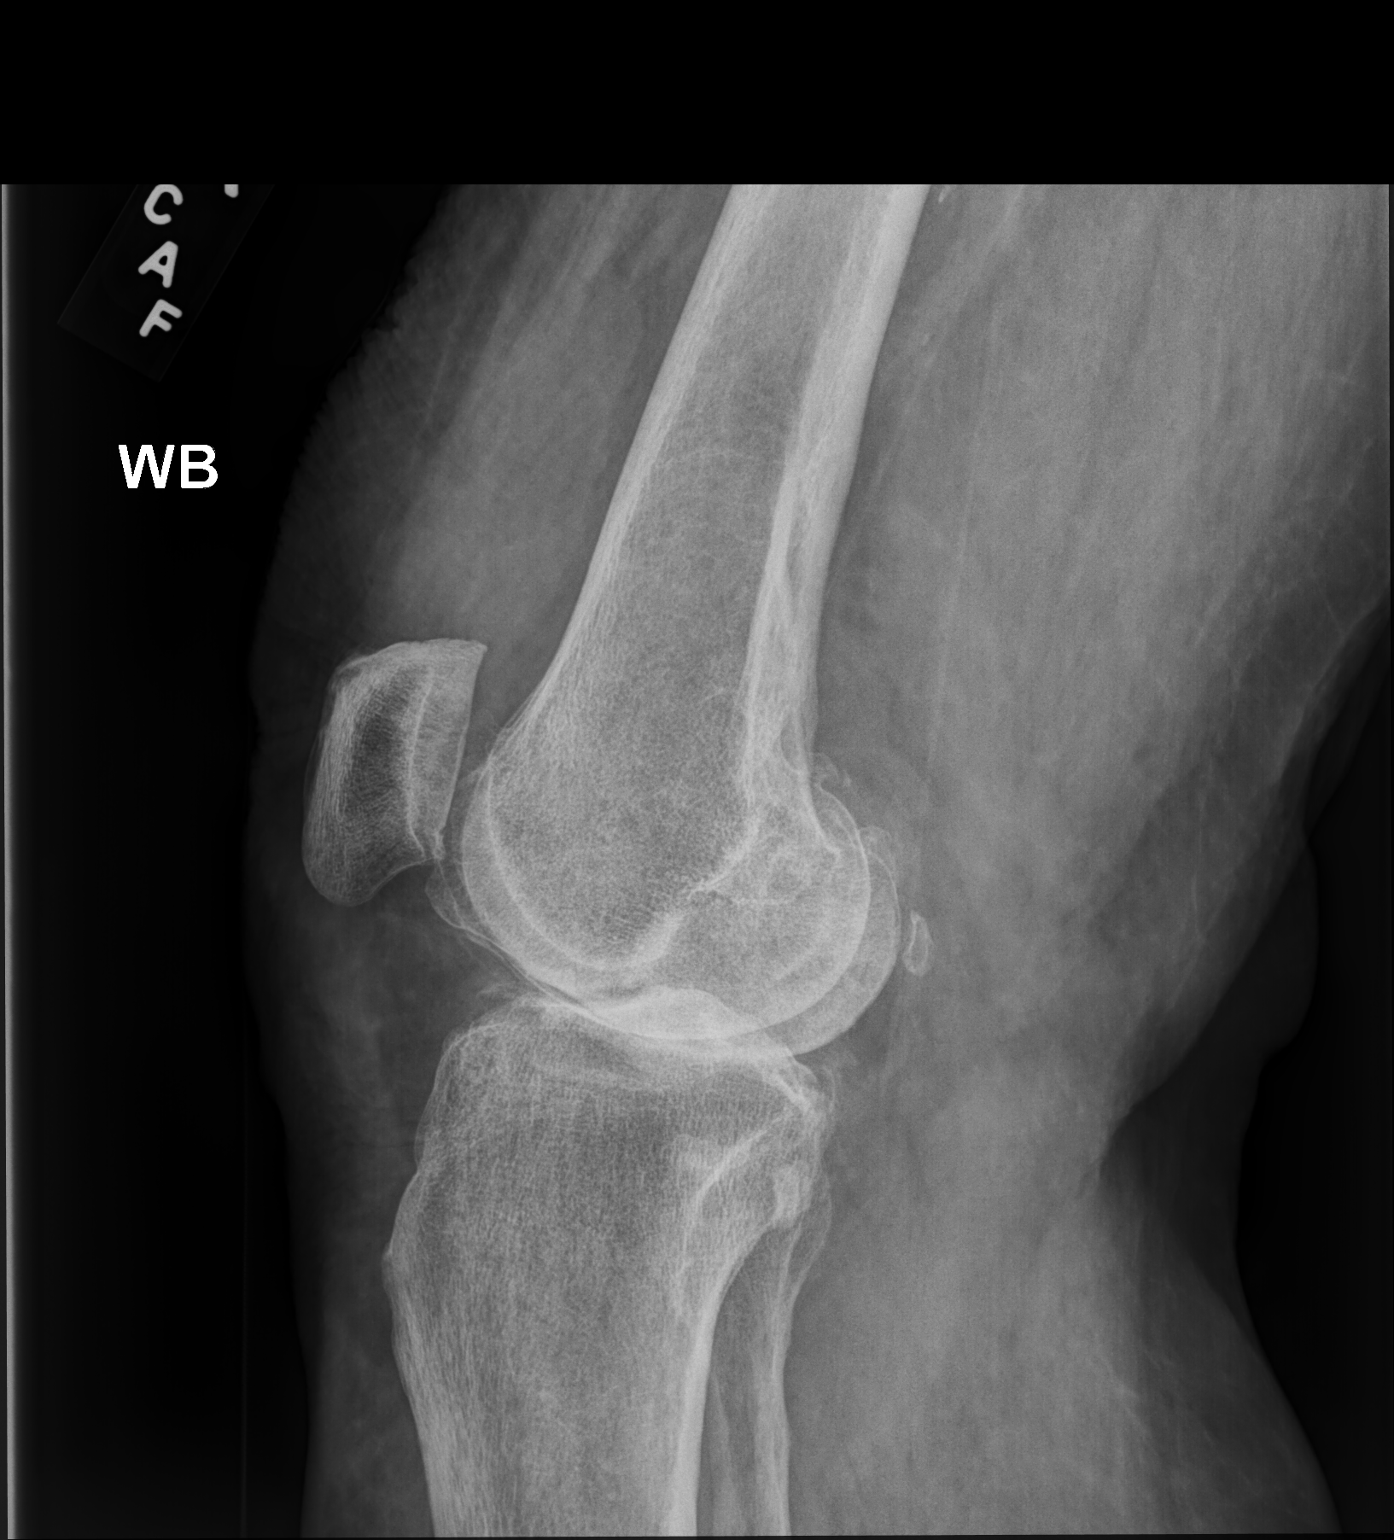

[2 of 2 positions shown; findings below may reference images not displayed]

FINDINGS: There is significant valgus deformity of the right knee with
resultant complete loss of lateral compartment joint space and
sclerosis with spurring. Chondrocalcinosis also is present which may
indicate CPPD arthropathy. There may be a small amount of right knee
joint fluid present. The patellofemoral space of the right knee is
within normal limits. Components of left total knee replacement are
noted and appear to be in good position with no complicating
features.
IMPRESSION: 1. Significant valgus deformity of the right knee with complete loss
of joint space involving the lateral compartment.
2. Chondrocalcinosis.
3. Tiny amount of right knee joint fluid.

## 2020-03-11 DEATH — deceased
